# Patient Record
Sex: Female | Born: 1970
Health system: Southern US, Community
[De-identification: ages and names within clinical notes are randomized; demographics above are authoritative.]

## PROBLEM LIST (undated history)

## (undated) DIAGNOSIS — O020 Blighted ovum and nonhydatidiform mole: Secondary | ICD-10-CM

## (undated) DIAGNOSIS — K219 Gastro-esophageal reflux disease without esophagitis: Secondary | ICD-10-CM

## (undated) DIAGNOSIS — B9689 Other specified bacterial agents as the cause of diseases classified elsewhere: Secondary | ICD-10-CM

## (undated) DIAGNOSIS — J45998 Other asthma: Secondary | ICD-10-CM

## (undated) DIAGNOSIS — M1990 Unspecified osteoarthritis, unspecified site: Secondary | ICD-10-CM

## (undated) DIAGNOSIS — N760 Acute vaginitis: Secondary | ICD-10-CM

## (undated) DIAGNOSIS — N390 Urinary tract infection, site not specified: Secondary | ICD-10-CM

## (undated) DIAGNOSIS — O039 Complete or unspecified spontaneous abortion without complication: Secondary | ICD-10-CM

## (undated) HISTORY — PX: APPENDECTOMY: SHX54

---

## 1999-03-28 ENCOUNTER — Emergency Department (HOSPITAL_COMMUNITY): Admission: EM | Admit: 1999-03-28 | Discharge: 1999-03-28 | Payer: Self-pay | Admitting: Emergency Medicine

## 1999-11-23 ENCOUNTER — Encounter (INDEPENDENT_AMBULATORY_CARE_PROVIDER_SITE_OTHER): Payer: Self-pay | Admitting: *Deleted

## 1999-11-23 ENCOUNTER — Ambulatory Visit (HOSPITAL_COMMUNITY): Admission: RE | Admit: 1999-11-23 | Discharge: 1999-11-23 | Payer: Self-pay | Admitting: Obstetrics and Gynecology

## 2002-05-07 ENCOUNTER — Other Ambulatory Visit: Admission: RE | Admit: 2002-05-07 | Discharge: 2002-05-07 | Payer: Self-pay | Admitting: *Deleted

## 2002-09-27 ENCOUNTER — Inpatient Hospital Stay (HOSPITAL_COMMUNITY): Admission: AD | Admit: 2002-09-27 | Discharge: 2002-09-27 | Payer: Self-pay | Admitting: *Deleted

## 2002-10-19 ENCOUNTER — Inpatient Hospital Stay (HOSPITAL_COMMUNITY): Admission: AD | Admit: 2002-10-19 | Discharge: 2002-10-19 | Payer: Self-pay | Admitting: Obstetrics and Gynecology

## 2002-11-02 ENCOUNTER — Inpatient Hospital Stay (HOSPITAL_COMMUNITY): Admission: AD | Admit: 2002-11-02 | Discharge: 2002-11-02 | Payer: Self-pay | Admitting: Obstetrics and Gynecology

## 2002-11-03 ENCOUNTER — Encounter (INDEPENDENT_AMBULATORY_CARE_PROVIDER_SITE_OTHER): Payer: Self-pay

## 2002-11-03 ENCOUNTER — Inpatient Hospital Stay (HOSPITAL_COMMUNITY): Admission: AD | Admit: 2002-11-03 | Discharge: 2002-11-06 | Payer: Self-pay | Admitting: Obstetrics and Gynecology

## 2003-01-11 ENCOUNTER — Other Ambulatory Visit: Admission: RE | Admit: 2003-01-11 | Discharge: 2003-01-11 | Payer: Self-pay | Admitting: Obstetrics and Gynecology

## 2008-10-17 ENCOUNTER — Emergency Department (HOSPITAL_COMMUNITY): Admission: EM | Admit: 2008-10-17 | Discharge: 2008-10-17 | Payer: Self-pay | Admitting: Emergency Medicine

## 2009-06-01 LAB — CONVERTED CEMR LAB

## 2009-06-16 ENCOUNTER — Ambulatory Visit (HOSPITAL_COMMUNITY): Admission: RE | Admit: 2009-06-16 | Discharge: 2009-06-16 | Payer: Self-pay | Admitting: Pediatrics

## 2009-10-12 ENCOUNTER — Emergency Department (HOSPITAL_COMMUNITY): Admission: EM | Admit: 2009-10-12 | Discharge: 2009-10-12 | Payer: Self-pay | Admitting: Family Medicine

## 2009-11-02 ENCOUNTER — Ambulatory Visit: Payer: Self-pay | Admitting: Physician Assistant

## 2009-11-02 LAB — CONVERTED CEMR LAB
ALT: 15 units/L (ref 0–35)
AST: 19 units/L (ref 0–37)
Albumin: 4.7 g/dL (ref 3.5–5.2)
BUN: 9 mg/dL (ref 6–23)
Barbiturate Quant, Ur: NEGATIVE
Basophils Absolute: 0 10*3/uL (ref 0.0–0.1)
Cocaine Metabolites: NEGATIVE
Creatinine, Ser: 0.72 mg/dL (ref 0.40–1.20)
Hemoglobin: 14.1 g/dL (ref 12.0–15.0)
Lymphocytes Relative: 34 % (ref 12–46)
Lymphs Abs: 3.2 10*3/uL (ref 0.7–4.0)
MCHC: 32.6 g/dL (ref 30.0–36.0)
MCV: 87.8 fL (ref 78.0–100.0)
Methadone: NEGATIVE
Monocytes Absolute: 0.5 10*3/uL (ref 0.1–1.0)
Monocytes Relative: 6 % (ref 3–12)
Opiate Screen, Urine: NEGATIVE
Phencyclidine (PCP): NEGATIVE
RBC: 4.92 M/uL (ref 3.87–5.11)
Total Bilirubin: 0.4 mg/dL (ref 0.3–1.2)
Total Protein: 7.7 g/dL (ref 6.0–8.3)
WBC: 9.4 10*3/uL (ref 4.0–10.5)

## 2009-11-03 ENCOUNTER — Encounter: Payer: Self-pay | Admitting: Physician Assistant

## 2009-11-21 ENCOUNTER — Encounter: Payer: Self-pay | Admitting: Physician Assistant

## 2009-11-21 ENCOUNTER — Ambulatory Visit: Payer: Self-pay | Admitting: Internal Medicine

## 2009-11-23 DIAGNOSIS — L439 Lichen planus, unspecified: Secondary | ICD-10-CM | POA: Insufficient documentation

## 2009-12-04 ENCOUNTER — Ambulatory Visit: Payer: Self-pay | Admitting: Physician Assistant

## 2009-12-04 LAB — CONVERTED CEMR LAB: Hepatitis B Surface Ag: NEGATIVE

## 2009-12-05 ENCOUNTER — Encounter: Payer: Self-pay | Admitting: Physician Assistant

## 2010-03-13 ENCOUNTER — Ambulatory Visit: Payer: Self-pay | Admitting: Physician Assistant

## 2010-07-22 ENCOUNTER — Emergency Department (HOSPITAL_COMMUNITY): Admission: EM | Admit: 2010-07-22 | Discharge: 2010-07-22 | Payer: Self-pay | Admitting: Emergency Medicine

## 2010-09-14 ENCOUNTER — Emergency Department (HOSPITAL_COMMUNITY): Admission: EM | Admit: 2010-09-14 | Discharge: 2010-09-14 | Payer: Self-pay | Admitting: Emergency Medicine

## 2010-09-24 ENCOUNTER — Ambulatory Visit: Payer: Self-pay | Admitting: Nurse Practitioner

## 2010-09-24 DIAGNOSIS — N3000 Acute cystitis without hematuria: Secondary | ICD-10-CM | POA: Insufficient documentation

## 2010-09-24 DIAGNOSIS — N920 Excessive and frequent menstruation with regular cycle: Secondary | ICD-10-CM | POA: Insufficient documentation

## 2010-09-24 LAB — CONVERTED CEMR LAB: Rapid HIV Screen: NEGATIVE

## 2010-09-25 ENCOUNTER — Encounter (INDEPENDENT_AMBULATORY_CARE_PROVIDER_SITE_OTHER): Payer: Self-pay | Admitting: Nurse Practitioner

## 2010-09-26 ENCOUNTER — Encounter (INDEPENDENT_AMBULATORY_CARE_PROVIDER_SITE_OTHER): Payer: Self-pay | Admitting: Nurse Practitioner

## 2010-09-26 LAB — CONVERTED CEMR LAB
ALT: 15 units/L (ref 0–35)
AST: 14 units/L (ref 0–37)
Basophils Absolute: 0.1 10*3/uL (ref 0.0–0.1)
Basophils Relative: 0 % (ref 0–1)
Calcium: 9.1 mg/dL (ref 8.4–10.5)
Chloride: 106 meq/L (ref 96–112)
Cholesterol: 199 mg/dL (ref 0–200)
Eosinophils Absolute: 0.2 10*3/uL (ref 0.0–0.7)
Eosinophils Relative: 2 % (ref 0–5)
LDL Cholesterol: 125 mg/dL — ABNORMAL HIGH (ref 0–99)
Lymphocytes Relative: 29 % (ref 12–46)
Lymphs Abs: 3.4 10*3/uL (ref 0.7–4.0)
Monocytes Absolute: 0.7 10*3/uL (ref 0.1–1.0)
Monocytes Relative: 6 % (ref 3–12)
Neutrophils Relative %: 63 % (ref 43–77)
Potassium: 3.8 meq/L (ref 3.5–5.3)
RBC: 4.72 M/uL (ref 3.87–5.11)
Total Protein: 7.1 g/dL (ref 6.0–8.3)
Triglycerides: 177 mg/dL — ABNORMAL HIGH (ref <150)

## 2010-11-30 ENCOUNTER — Encounter (INDEPENDENT_AMBULATORY_CARE_PROVIDER_SITE_OTHER): Payer: Self-pay | Admitting: Nurse Practitioner

## 2010-11-30 ENCOUNTER — Ambulatory Visit: Payer: Self-pay | Admitting: Nurse Practitioner

## 2010-11-30 DIAGNOSIS — E669 Obesity, unspecified: Secondary | ICD-10-CM

## 2010-11-30 DIAGNOSIS — F17200 Nicotine dependence, unspecified, uncomplicated: Secondary | ICD-10-CM

## 2010-11-30 DIAGNOSIS — K029 Dental caries, unspecified: Secondary | ICD-10-CM | POA: Insufficient documentation

## 2010-11-30 LAB — CONVERTED CEMR LAB
Glucose, Urine, Semiquant: NEGATIVE
KOH Prep: NEGATIVE
Protein, U semiquant: NEGATIVE
pH: 7.5

## 2010-12-01 ENCOUNTER — Encounter (INDEPENDENT_AMBULATORY_CARE_PROVIDER_SITE_OTHER): Payer: Self-pay | Admitting: Internal Medicine

## 2010-12-06 ENCOUNTER — Encounter (INDEPENDENT_AMBULATORY_CARE_PROVIDER_SITE_OTHER): Payer: Self-pay | Admitting: Nurse Practitioner

## 2010-12-06 LAB — CONVERTED CEMR LAB
Basophils Absolute: 0.1 10*3/uL (ref 0.0–0.1)
HCT: 43.4 % (ref 36.0–46.0)
Monocytes Absolute: 0.8 10*3/uL (ref 0.1–1.0)
Monocytes Relative: 6 % (ref 3–12)
Neutro Abs: 9.3 10*3/uL — ABNORMAL HIGH (ref 1.7–7.7)
Platelets: 319 10*3/uL (ref 150–400)
RBC: 4.72 M/uL (ref 3.87–5.11)

## 2010-12-07 ENCOUNTER — Encounter
Admission: RE | Admit: 2010-12-07 | Discharge: 2010-12-07 | Payer: Self-pay | Source: Home / Self Care | Attending: Internal Medicine | Admitting: Internal Medicine

## 2010-12-23 ENCOUNTER — Encounter: Payer: Self-pay | Admitting: *Deleted

## 2011-01-01 NOTE — Progress Notes (Signed)
Summary: Office Visit/DEPRESSION SCREENING  Office Visit/DEPRESSION SCREENING   Imported By: Arta Bruce 12/22/2009 15:28:34  _____________________________________________________________________  External Attachment:    Type:   Image     Comment:   External Document

## 2011-01-01 NOTE — Assessment & Plan Note (Signed)
Summary: 1 MONTH FU ON SKIN///KT   Vital Signs:  Patient profile:   40 year old female Height:      65.5 inches Weight:      238 pounds BMI:     39.14 Temp:     97.8 degrees F oral Pulse rate:   88 / minute Pulse rhythm:   regular Resp:     18 per minute BP sitting:   118 / 81  (left arm) Cuff size:   large  Vitals Entered By: Armenia Shannon (December 04, 2009 10:58 AM) CC: one month f/u... Is Patient Diabetic? No Pain Assessment Patient in pain? no       Does patient need assistance? Functional Status Self care Ambulation Normal   CC:  one month f/u....  History of Present Illness: Here for f/u. Did see Derm. at [facility]. Had a hard time getting desonide.  Finally got about a week ago. Still has lesions scattered about her BUE and back.  Also, has had some pain in her mouth, but seems to be getting better.  Lesions painful if she bumps them.  Does not feel like things are getting worse. She thought the dermatologist was rude.  She had her son see someone at [facility].  WOuld like to go there if needed.   Current Medications (verified): 1)  Desonide 0.05 % Oint (Desonide) .... Apply Two Times A Day  Allergies (verified): No Known Drug Allergies  Physical Exam  General:  alert, well-developed, and well-nourished.   Head:  normocephalic and atraumatic.   Mouth:  no oral lesions noted Neck:  supple.   Lungs:  normal breath sounds, no crackles, and no wheezes.   Heart:  normal rate, regular rhythm, and no murmur.   Skin:  scattered papular lesions with violaceous color    Impression & Recommendations:  Problem # 1:  PREVENTIVE HEALTH CARE (ICD-V70.0) patient sees Dr. Normand Sloop with [facility] and gets paps through her  Problem # 2:  LICHEN PLANUS (ICD-697.0) just got meds will try them for a few weeks patient not happy with Derm. at [facility]. if no reponse to desonide, will refer to [facility]  Complete Medication List: 1)  Desonide 0.05 % Oint  (Desonide) .... Apply two times a day  Patient Instructions: 1)  Call Amarah Brossman in 3-4 weeks to update him on your rash.  If no better, will refer you to private dermatologist. 2)  Please schedule a follow-up appointment in 3 months with Roemello Speyer for skin problem. 3)

## 2011-01-01 NOTE — Assessment & Plan Note (Signed)
Summary: 3 month fu///kt   Vital Signs:  Patient profile:   40 year old female Height:      65.5 inches Weight:      214 pounds BMI:     35.20 Temp:     98.1 degrees F oral Pulse rate:   80 / minute Pulse rhythm:   regular Resp:     18 per minute BP sitting:   110 / 72  (right arm)  Vitals Entered By: Armenia Shannon (March 13, 2010 8:52 AM) CC: pt is here to f/u on rash.... pt says the rash is better and she would like a bleaching cream to get rid of the points... Is Patient Diabetic? No Pain Assessment Patient in pain? no       Does patient need assistance? Functional Status Self care Ambulation Normal   Primary Care Provider:  Tereso Newcomer PA-C  CC:  pt is here to f/u on rash.... pt says the rash is better and she would like a bleaching cream to get rid of the points....  History of Present Illness: Here for f/u on Lichen Planus. She used desonide for one month.  Had gone to derm clinic.   Rash is resolved.  However, she now has darkened pigmentation in areas where lesions were located. She is asking about using a bleaching cream.  She says her son had to use one after he had lichen planus.   Current Medications (verified): 1)  Desonide 0.05 % Oint (Desonide) .... Apply Two Times A Day  Allergies (verified): 1)  ! Penicillin  Physical Exam  General:  alert and well-developed.   Head:  normocephalic and atraumatic.   Lungs:  normal breath sounds.   Heart:  normal rate and regular rhythm.   Neurologic:  alert & oriented X3 and cranial nerves II-XII intact.   Skin:  several scattered areas of hyperpigmentation about her BUE and back Psych:  normally interactive.     Impression & Recommendations:  Problem # 1:  LICHEN PLANUS (ICD-697.0) resolved now with residual hyperpigmentation  spoke with Toni Amend in [place] pharm will use Hydroquinone 4 % two times a day should limit to small areas at a time d/c use if no response after 2 mos  Problem # 2:  PREVENTIVE  HEALTH CARE (ICD-V70.0) usually gets CPP with Dr. Normand Sloop but would have to pay cash last pap in 07/2009 schedule cpp in Aug. .  Wonda Cheng to cancel if gets appt with Dr. Normand Sloop  Complete Medication List: 1)  Desonide 0.05 % Oint (Desonide) .... Apply two times a day 2)  Hydroquinone 4 % Crea (Hydroquinone) .... Apply to areas two times a day until improvement; limit to small areas at one time; do not use for longer than 2 months in one area  Patient Instructions: 1)  Please schedule a follow-up appointment in 4-5 months with Kellyann Ordway for CPP. 2)  Come fasting for labs (nothing to eat or drink after midnight except water). 3)  Hydroquinone:  Use cream on small areas at a time.  Do not use more than 2 months in one area.  Stop if it causes irritation.  Prescriptions: HYDROQUINONE 4 % CREA (HYDROQUINONE) apply to areas two times a day until improvement; limit to small areas at one time; do not use for longer than 2 months in one area  #30 grams x 2   Entered and Authorized by:   Tereso Newcomer PA-C   Signed by:   Tereso Newcomer PA-C on 03/13/2010  Method used:   Printed then faxed to ...       [facility] (retail)       [address] [place], [place]  [address]       Ph: [phone]       Fax: [phone]   RxID:   [id number]

## 2011-01-01 NOTE — Letter (Signed)
Summary: [facility]   Imported By: Arta Bruce 12/27/2009 15:12:05  _____________________________________________________________________  External Attachment:    Type:   Image     Comment:   External Document

## 2011-01-01 NOTE — Assessment & Plan Note (Signed)
Summary: Left without being seen   Vital Signs:  Patient profile:   40 year old female Pulse rhythm:   regular Cuff size:   large  Allergies: 1)  ! Penicillin   Complete Medication List: 1)  Desonide 0.05 % Oint (Desonide) .... Apply two times a day 2)  Hydroquinone 4 % Crea (Hydroquinone) .... Apply to areas two times a day until improvement; limit to small areas at one time; do not use for longer than 2 months in one area 3)  Bactrim Ds 800-160 Mg Tabs (Sulfamethoxazole-trimethoprim) .... One tablet by mouth two times a day for infection

## 2011-01-01 NOTE — Letter (Signed)
Summary: *HSN Results Follow up  [facility]  [address] [place], [place] [address]   Phone: [phone]  Fax: [phone]      12/05/2009   [name] [address] [place], [place]  [address]   Dear  Ms. Monica Walton,                            ____S.Drinkard,FNP   ____D. Gore,FNP       ____B. McPherson,MD   ____V. Rankins,MD    ____E. Mulberry,MD    ____N. Daphine Deutscher, FNP  ____D. Reche Dixon, MD    ____K. Philipp Deputy, MD    _x___S. Alben Spittle, PA-C    This letter is to inform you that your recent test(s):  _______Pap Smear    ___x____Lab Test     _______X-ray    ___x____ is within acceptable limits  _______ requires a medication change  _______ requires a follow-up lab visit  _______ requires a follow-up visit with your provider   Comments: Hepatitis labs were completely normal.       _________________________________________________________ If you have any questions, please contact our office                     Sincerely,  Tereso Newcomer PA-C [facility]

## 2011-01-01 NOTE — Letter (Signed)
Summary: Lipid Letter  [facility]  [address] [place], [place] [address]   Phone: [phone]  Fax: [phone]    09/26/2010  [name] [address], [place]  [address]  Dear Monica Walton:  We have carefully reviewed your last lipid profile from 09/24/2010 and the results are noted below with a summary of recommendations for lipid management.    Cholesterol:       199     Goal: less than 200   HDL "good" Cholesterol:   39     Goal: greater than 40   LDL "bad" Cholesterol:   125     Goal:  less than 130   Triglycerides:       177     Goal: less than 150  See above results of your cholesterol labs done during recent office visit.    If you have any questions, please call. We appreciate being able to work with you.   Sincerely,    Lehman Prom, FNP [facility]

## 2011-01-01 NOTE — Assessment & Plan Note (Signed)
Summary: Acute - Cystitis   Vital Signs:  Patient profile:   40 year old female LMP:     09/22/2010 Height:      65.5 inches Weight:      211 pounds BMI:     34.70 Temp:     98.7 degrees F oral Pulse rate:   76 / minute Pulse rhythm:   regular Resp:     16 per minute BP sitting:   112 / 76  (left arm) Cuff size:   large  Vitals Entered By: Armenia Shannon (September 24, 2010 4:17 PM)  Nutrition Counseling: Patient's BMI is greater than 25 and therefore counseled on weight management options. CC: pt is here for bladder infection.... pt says she went to urgent care on 10-14... pt says she is having blood clots... pt says she was leaking fluid for three/four days before her cycle... Pain Assessment Patient in pain? no       Does patient need assistance? Functional Status Self care Ambulation Normal LMP (date): 09/22/2010     Enter LMP: 09/22/2010 Last PAP Result historical per patient - sees Dr. Normand Sloop   Primary Care Lyzbeth Genrich:  Tereso Newcomer PA-C  CC:  pt is here for bladder infection.... pt says she went to urgent care on 10-14... pt says she is having blood clots... pt says she was leaking fluid for three/four days before her cycle....  History of Present Illness:  Pt into the office for f/u on urinary symptoms. She presents today with Cipro that she started on 09/15/2010. she has 4 pills left in her bottle. She reports frequent UTI"S during her previous pregancy  Menses - Previous menses September 28 - started out extremely heavy and last 7 days.  currently on and started October 22. She had to wear lots of pads and heavy flow with clotting. -cramps but she had some abdominal discomfort after the cystitis dx +back pain  3 living children last PAP done 07/2009   Current Medications (verified): 1)  Desonide 0.05 % Oint (Desonide) .... Apply Two Times A Day 2)  Hydroquinone 4 % Crea (Hydroquinone) .... Apply To Areas Two Times A Day Until Improvement; Limit To Small  Areas At One Time; Do Not Use For Longer Than 2 Months in One Area  Allergies (verified): 1)  ! Penicillin  Review of Systems CV:  Denies chest pain or discomfort. Resp:  Denies cough. GI:  Complains of abdominal pain; denies constipation, nausea, and vomiting. GU:  Complains of abnormal vaginal bleeding and discharge; denies dysuria, nocturia, and urinary frequency; heavy menses for september and october. MS:  Complains of low back pain.  Physical Exam  General:  alert.   Head:  normocephalic.   Eyes:  pupils reactive to light.   Lungs:  normal breath sounds.   Heart:  normal rate and regular rhythm.     Impression & Recommendations:  Problem # 1:  ACUTE CYSTITIS (ICD-595.0)  will recheck urine today advised pt to continue cipro until the end of the course plenty of water Orders: T-Culture, Urine ([id number])  Problem # 2:  MENORRHAGIA (ICD-626.2) will start with labs but may need additional workup with u/s Orders: T-Comprehensive Metabolic Panel (44010-27253) T-CBC w/Diff (66440-34742) T-TSH (59563-87564) T-Lipid Profile (33295-18841) Rapid HIV  (66063) T-Syphilis Test (RPR) (01601-09323)  Problem # 3:  NEED PROPHYLACTIC VACCINATION&INOCULATION FLU (ICD-V04.81) given today in office  Complete Medication List: 1)  Desonide 0.05 % Oint (Desonide) .... Apply two times a day 2)  Hydroquinone 4 %  Crea (Hydroquinone) .... Apply to areas two times a day until improvement; limit to small areas at one time; do not use for longer than 2 months in one area  Other Orders: Flu Vaccine 65yrs + ([address]) Admin 1st Vaccine ([address])  Patient Instructions: 1)  Take all your antibiotics as ordered.  Remember to drink plenty of water.  Wipe from front to back.  Wear cotton underwear. 2)  You given the flu vaccine today. 3)  Schedule an appointment for a complete physical exam 4)  You will get mammogram, u/a, PHQ-9    Orders Added: 1)  Est. Patient Level III [[address]] 2)   T-Comprehensive Metabolic Panel [80053-22900] 3)  T-CBC w/Diff [91478-29562] 4)  T-TSH [13086-57846] 5)  T-Lipid Profile [96295-28413] 6)  Rapid HIV  [92370] 7)  T-Syphilis Test (RPR) [24401-02725] 8)  T-Culture, Urine [36644-03474] 9)  Flu Vaccine 68yrs + [[address]] 10)  Admin 1st Vaccine [[address]]   Immunizations Administered:  Influenza Vaccine # 1:    Vaccine Type: Fluvax 3+    Site: left deltoid    Mfr: GlaxoSmithKline    Dose: 0.5 ml    Route: IM    Given by: Armenia Shannon    Exp. Date: 06/01/2011    Lot #: [id number]    VIS given: 06/26/10 version given September 24, 2010.  Flu Vaccine Consent Questions:    Do you have a history of severe allergic reactions to this vaccine? no    Any prior history of allergic reactions to egg and/or gelatin? no    Do you have a sensitivity to the preservative Thimersol? no    Do you have a past history of Guillan-Barre Syndrome? no    Do you currently have an acute febrile illness? no    Have you ever had a severe reaction to latex? no    Vaccine information given and explained to patient? yes    Are you currently pregnant? no   Immunizations Administered:  Influenza Vaccine # 1:    Vaccine Type: Fluvax 3+    Site: left deltoid    Mfr: GlaxoSmithKline    Dose: 0.5 ml    Route: IM    Given by: Armenia Shannon    Exp. Date: 06/01/2011    Lot #: [id number]    VIS given: 06/26/10 version given September 24, 2010.  Prevention & Chronic Care Immunizations   Influenza vaccine: Fluvax 3+  (09/24/2010)    Tetanus booster: Not documented    Pneumococcal vaccine: Not documented  Other Screening   Pap smear: historical per patient - sees Dr. Normand Sloop  (06/01/2009)   Smoking status: quit  (11/02/2009)  Lipids   Total Cholesterol: Not documented   LDL: Not documented   LDL Direct: Not documented   HDL: Not documented   Triglycerides: Not documented   Nursing Instructions: Give Flu vaccine today        Laboratory Results   Date/Time Received: September 24, 2010   Other Tests  Rapid HIV: negative

## 2011-01-03 NOTE — Progress Notes (Signed)
Summary: //DEPRESSION SCREENING  //DEPRESSION SCREENING   Imported By: Arta Bruce 11/30/2010 10:27:26  _____________________________________________________________________  External Attachment:    Type:   Image     Comment:   External Document

## 2011-01-03 NOTE — Letter (Signed)
Summary: *HSN Results Follow up  [facility]  [address] [place], [place] [address]   Phone: [phone]  Fax: [phone]      12/06/2010   TONJI ELLIFF [address] [place], [place]  [address]   Dear  Ms. Shalane MOORE,                            ____S.Drinkard,FNP   ____D. Gore,FNP       ____B. McPherson,MD   ____V. Rankins,MD    ____E. Mulberry,MD    __X__N. Daphine Deutscher, FNP  ____D. Reche Dixon, MD    ____K. Philipp Deputy, MD    ____Other     This letter is to inform you that your recent test(s):  ___X____Pap Smear    _______Lab Test     _______X-ray   Pap Smear results are __________________________.      _________________________________________________________ If you have any questions, please contact our office [phone].                    Sincerely,    Lehman Prom FNP [facility]

## 2011-01-03 NOTE — Assessment & Plan Note (Signed)
Summary: Complete Physical Exam   Vital Signs:  Patient profile:   40 year old female Menstrual status:  regular LMP:     11/19/2010 Weight:      211.2 pounds Temp:     98.2 degrees F oral Pulse rate:   72 / minute Pulse rhythm:   regular Resp:     16 per minute BP sitting:   106 / 80  (left arm) Cuff size:   large  Vitals Entered By: Levon Hedger (November 30, 2010 9:01 AM) CC: cpp Is Patient Diabetic? No Pain Assessment Patient in pain? no       Does patient need assistance? Functional Status Self care Ambulation Normal  Vision Screening:Left eye with correction: 20 / 20 Right eye with correction: 20 / 15-1 Both eyes with correction: 20 / 13-1        Vision Entered By: Levon Hedger (November 30, 2010 9:34 AM) LMP (date): 11/19/2010 LMP - Character: heavy     Menstrual Status regular Enter LMP: 11/19/2010 Last PAP Result historical per patient - sees Dr. Normand Sloop   Primary Care Provider:  Tereso Newcomer PA-C  CC:  cpp.  History of Present Illness:  Pt into the office for a complete physical exam  PAP - Last PAP done 07/2009. No family history of cervical or ovarian cancer in the family 3 children  Pt is in a stable relationship - no current birth control Menses monthly but she has recently noticed that it his getting heavier with clots.  No cramps.  Mammogram - never had a mammogram.   Great Aunt (maternal) deceased of breast cancer no current self breast exams at home  Optho - wear contacts.Yearly optho exams   Dental  - no recent dental exam..  Admits that she does need to go to the dentist b/c she has a wisdom tooth that needs extraction  tdap - last over 10 years ago  Obesity - no change in weight since last visit.  She is not exercising but she does admit that she needs to lose weight  Habits & Providers  Alcohol-Tobacco-Diet     Alcohol drinks/day: 0     Tobacco Status: current     Tobacco Counseling: to quit use of tobacco  products     Year Quit: 2003     Pack years: 2  Exercise-Depression-Behavior     Have you felt down or hopeless? no     Have you felt little pleasure in things? no     Depression Counseling: not indicated; screening negative for depression     Drug Use: no  Comments: PHQ- 9 score = 3 [employer] membershp but pt is not going to the gym.  She will restart as her new years resolution  Allergies: 1)  ! Penicillin  Social History: Smoking Status:  current  Review of Systems General:  Denies fever. Eyes:  Denies discharge. ENT:  Denies earache. CV:  Denies chest pain or discomfort. Resp:  Denies cough. GI:  Denies abdominal pain, nausea, and vomiting. GU:  Denies abnormal vaginal bleeding. MS:  Denies joint pain. Derm:  Denies dryness. Neuro:  Denies headaches. Psych:  Denies anxiety and depression. Endo:  Denies excessive urination.  Physical Exam  General:  alert.   Head:  normocephalic.   Eyes:  pupils equal and pupils round.  contacts Ears:  bil TM with bony landmarks present Nose:  no nasal discharge.   Mouth:  pharynx pink and moist and fair dentition.   Neck:  supple.   Chest Wall:  no mass.   Breasts:  right breast - upper outer quad 11 o'clock - palpable, nodule left breast - no nodules or lumps noted. Lungs:  normal breath sounds.   Heart:  normal rate and regular rhythm.   Abdomen:  normal bowel sounds.   Rectal:  defer Msk:  normal ROM.   Pulses:  R radial normal and L radial normal.   Extremities:  no edema Neurologic:  alert & oriented X3, cranial nerves II-XII intact, and gait normal.   Skin:  multiple tattoos Psych:  Oriented X3.    Pelvic Exam  Vulva:      normal appearance.   Urethra and Bladder:      Urethra--normal.  Bladder--normal.   Vagina:      physiologic discharge.   Cervix:      midposition, parous.   Uterus:      smooth.   Adnexa:      nontender bilaterally.      Impression & Recommendations:  Problem # 1:  ROUTINE  GYNECOLOGICAL EXAMINATION (ICD-V72.31) PAP done PHQ-9 score = 3 rec optho and dental exam Orders: UA Dipstick w/o Micro (manual) (14782) T- GC Chlamydia (95621) KOH/ WET Mount [phone]) Pap Smear, Thin Prep ( Collection of) (H8469) EKG w/ Interpretation (93000) Vision Screening (62952)  Problem # 2:  UNSPECIFIED BREAST SCREENING (ICD-V76.10) lump noted today during exam self breast exam placcard given to pt Orders: Mammogram (Diagnostic) (Mammo)  Problem # 3:  TOBACCO ABUSE (ICD-305.1) advised cessation  Problem # 4:  DENTAL CARIES (ICD-521.00)  Orders: Dental Referral (Dentist)  Problem # 5:  ACUTE CYSTITIS (ICD-595.0) pt finished abx as per previous visit. will recheck urine The following medications were removed from the medication list:    Bactrim Ds 800-160 Mg Tabs (Sulfamethoxazole-trimethoprim) ..... One tablet by mouth two times a day for infection  Orders: T-Culture, Urine (84132-44010)  Problem # 6:  OBESITY (ICD-278.00) pt advised to attend the [employer] program to be held here she will also restart her gym membership  Complete Medication List: 1)  Desonide 0.05 % Oint (Desonide) .... Apply two times a day 2)  Hydroquinone 4 % Crea (Hydroquinone) .... Apply to areas two times a day until improvement; limit to small areas at one time; do not use for longer than 2 months in one area  Other Orders: T-CBC w/Diff (27253-66440)  Patient Instructions: 1)  Join the [employer] in 2012. 2)  Keep your appointment for mammogram as scheduled. 3)  Be sure to start checking your breast monthly at home. 4)  Follow up yearly for a complete physical exam or sooner for acute illnesses. 5)  Need tdap on next visit   Orders Added: 1)  Dental Referral [Dentist] 2)  UA Dipstick w/o Micro (manual) [81002] 3)  T-Culture, Urine [34742-59563] 4)  T- GC Chlamydia [87564] 5)  KOH/ WET Mount [87210] 6)  Pap Smear, Thin Prep ( Collection of) [Q0091] 7)  EKG w/  Interpretation [93000] 8)  Vision Screening [99173] 9)  Est. Patient age 17-64 [phone] 31)  Mammogram (Diagnostic) [Mammo] 11)  T-CBC w/Diff [18841-66063]    Laboratory Results   Urine Tests  Date/Time Received: November 30, 2010 9:34 AM   Routine Urinalysis   Color: lt. yellow Appearance: Clear Glucose: negative   (Normal Range: Negative) Bilirubin: negative   (Normal Range: Negative) Ketone: negative   (Normal Range: Negative) Spec. Gravity: 1.015   (Normal Range: 1.003-1.035) Blood: negative   (Normal Range:  Negative) pH: 7.5   (Normal Range: 5.0-8.0) Protein: negative   (Normal Range: Negative) Urobilinogen: 0.2   (Normal Range: 0-1) Nitrite: negative   (Normal Range: Negative) Leukocyte Esterace: negative   (Normal Range: Negative)    Date/Time Received: November 30, 2010 10:13 AM   Wet Mount/KOH Source: vaginal WBC/hpf: 1-5 Bacteria/hpf: rare Clue cells/hpf: none Yeast/hpf: none Trichomonas/hpf: none    Laboratory Results   Urine Tests    Routine Urinalysis   Color: lt. yellow Appearance: Clear Glucose: negative   (Normal Range: Negative) Bilirubin: negative   (Normal Range: Negative) Ketone: negative   (Normal Range: Negative) Spec. Gravity: 1.015   (Normal Range: 1.003-1.035) Blood: negative   (Normal Range: Negative) pH: 7.5   (Normal Range: 5.0-8.0) Protein: negative   (Normal Range: Negative) Urobilinogen: 0.2   (Normal Range: 0-1) Nitrite: negative   (Normal Range: Negative) Leukocyte Esterace: negative   (Normal Range: Negative)      Wet Mount Wet Mount KOH: Negative    Prevention & Chronic Care Immunizations   Influenza vaccine: Fluvax 3+  (09/24/2010)    Tetanus booster: Not documented   Td booster deferral: Not available  (11/30/2010)    Pneumococcal vaccine: Not documented  Other Screening   Pap smear: historical per patient - sees Dr. Normand Sloop  (06/01/2009)   Smoking status: current  (11/30/2010)  Lipids   Total  Cholesterol: 199  (09/24/2010)   LDL: 125  (09/24/2010)   LDL Direct: Not documented   HDL: 39  (09/24/2010)   Triglycerides: 177  (09/24/2010)    EKG  Procedure date:  11/30/2010  Findings:      NSR

## 2011-01-03 NOTE — Letter (Signed)
Summary: DENTAL REFERRAL  DENTAL REFERRAL   Imported By: Arta Bruce 12/04/2010 16:55:46  _____________________________________________________________________  External Attachment:    Type:   Image     Comment:   External Document

## 2011-02-06 ENCOUNTER — Telehealth (INDEPENDENT_AMBULATORY_CARE_PROVIDER_SITE_OTHER): Payer: Self-pay | Admitting: Nurse Practitioner

## 2011-02-14 LAB — POCT URINALYSIS DIPSTICK
Glucose, UA: NEGATIVE mg/dL
Ketones, ur: NEGATIVE mg/dL
Protein, ur: 100 mg/dL — AB
Specific Gravity, Urine: >=1.03 (ref 1.005–1.030)
Urobilinogen, UA: 0.2 mg/dL (ref 0.0–1.0)

## 2011-02-14 LAB — POCT PREGNANCY, URINE: Preg Test, Ur: NEGATIVE

## 2011-02-15 LAB — URINE CULTURE: Colony Count: >=100000

## 2011-02-15 LAB — POCT URINALYSIS DIPSTICK
Nitrite: POSITIVE — AB
Protein, ur: 100 mg/dL — AB
pH: 5.5 (ref 5.0–8.0)

## 2011-02-15 LAB — POCT PREGNANCY, URINE: Preg Test, Ur: NEGATIVE

## 2011-02-28 NOTE — Progress Notes (Signed)
Summary: WANTS TO TALK TO NURSE  Phone Note Call from Patient Call back at [facility] Phone [phone]   Summary of Call: PT MISSED APPT TODAY BECAUSE SHE DID NOT KNOW OF THE NEW LOCATION. PT IS NOW SAYING SHE DOES NOT NEEDTO SEE THE DOCTOR BUT PERHAPS ONLY A NURSE VISIT.  THE NOTES SAID FU BLADDER INFECTION & NO PERIOD. PLS CALL PT BK. Initial call taken by: Ayesha Rumpf,  February 06, 2011 9:01 AM  Follow-up for Phone Call         pt confirms pregnant-need medical confirmation, Medicaid family, needs a call.  Ernestine Mcmurray,  February 07, 2011 8:24 AM   Left message on voicemail for pt. to return call.  Does she need to come in for pregnancy test to get referral to GYN?   Dutch Quint RN  February 08, 2011 12:34 PM    Additional Follow-up for Phone Call Additional follow up Details #1::        yes she needs a  nurse visit will need a date for LMP to calculate EDD Additional Follow-up by: Lehman Prom FNP,  February 08, 2011 2:15 PM    Additional Follow-up for Phone Call Additional follow up Details #2::    Left message on voicemail for pt. to return call.  Dutch Quint RN  February 13, 2011 5:55 PM  Left message on voicemail for pt. to return call.   Dutch Quint RN  February 14, 2011 2:45 PM  Micah Flesher to [facility] for urine pregnancy test, got due date of 09/29/11.  Waiting for Medicaid, has OB-GYN.  Wants to know if she needs to come back in for f/u UA with culture s/p ABT for UTI on 12/02/11?  Asymptomatic at present.  Dutch Quint RN  February 18, 2011 12:49 PM   Additional Follow-up for Phone Call Additional follow up Details #3:: Details for Additional Follow-up Action Taken: no need. looks like pt that done everything appropriately and she  is asymptomatice n.martin,fnp  February 18, 2011  2:30 PM  Pt. advised of provider's response.  Verbalized understanding and agreement.  Dutch Quint RN  February 18, 2011 4:31 PM

## 2011-04-19 NOTE — H&P (Signed)
NAME:  Monica Walton, Monica Walton                       ACCOUNT NO.:  [id number]   MEDICAL RECORD NO.:  [id number]                   PATIENT TYPE:  MAT   LOCATION:  [facility]                                 [facility]   PHYSICIAN:  Naima A. Dillard, M.D.              DATE OF BIRTH:  1971-08-14   DATE OF ADMISSION:  11/03/2002  DATE OF DISCHARGE:                                HISTORY & PHYSICAL   HISTORY OF PRESENT ILLNESS:  The patient is a 40 year old gravida 6, para 2-  0-3-2 at 72 and 5/7 weeks, EDD October 28, 2002 who presents with bright  red vaginal bleeding that turned the water red while she was in the bathtub  at approximately 12:30 this afternoon.  She reports positive fetal movement,  questionable rupture of membranes.  She has no headaches, visual changes, or  epigastric pain.  She has no abdominal pain and is only noticing mild  irregular contractions no different than they have been.  She was evaluated  yesterday in the maternity admissions unit following some vaginal bleeding  which was scant and limited to that period of time and resolved.  She had a  BPP which was 8/8.  The baby was in a vertex position.  Fluid was normal.  The placenta was noted to be 1.6 cm from the os.  At that time yesterday the  fetal heart rate was reactive and reassuring.  On vaginal examination she  was noted to be 1-2 cm dilated, 70% effaced, and the cephalic presenting  part high.  Only a scant amount of dark blood was noted with no active  bleeding, again, at that time and she was discharged home whereby she  returned today following this episode of bleeding.  Her pregnancy has been  followed by the M.D. service at [facility] and is remarkable for low lying  placenta 1.6 cm from the cervical os, history of three ABs, two elective ABs  and one SAB, history of migraines, history of preterm labor with term  delivery, rubella non-immune, penicillin allergy, questionable LMP, and  negative group B Strep.   This patient was initially evaluated at the office  of [facility] on June 24, 2002.  The patient transferred care from Dr. Elliot Gault at  23 weeks.  Her pregnancy has been complicated with placenta previa which has  resolved to a low lying placenta.  She has been size equal to dates  throughout.  LMP was determined by early pregnancy ultrasonography and  confirmed with follow-up.  She has been normotensive with no proteinuria.   OB HISTORY:  In 1993 patient had a normal spontaneous vaginal delivery at 41  weeks with the birth of a 7 pound 13 ounce female infant named Optometrist.  In  1997 a normal spontaneous vaginal delivery with the birth of a 6 pound 6  ounce female infant named Chales Abrahams at term with no complications.  In the year  2000 she had a spontaneous AB and then in November 2000 and in April 2001  patient had elective ABs and the present pregnancy.   PAST MEDICAL HISTORY:  She has a history of asthma and migraines, a history  of pyelonephritis with pregnancy.  She had an appendectomy at age 29.   FAMILY HISTORY:  Maternal grandmother with chronic hypertension and  diabetes.  Maternal grandfather with lung cancer.  Maternal grandmother with  stroke.   GENETIC HISTORY:  There is no family history of familial or genetic  disorders, children that died in infancy or that were born with birth  defects.   SOCIAL HISTORY:  The patient is a single African-American female.  The  father of the baby, Jerilynn Mages, is involved.  They are [facility] in their  faith.  Denies the use of tobacco, alcohol, or illicit drugs.   ALLERGIES:  The patient is allergic to PENICILLIN.   PRENATAL LABORATORY WORK:  On May 04, 2002 hemoglobin and hematocrit 11.9  and 34.5, platelets 258,000.  Blood type and Rh A+.  Antibody screen  negative.  Sickle cell trait negative.  VDRL nonreactive.  Rubella  nonimmune.  Hepatitis B surface antigen negative.  HIV declined.  Pap smear  within normal limits.  GC and Chlamydia negative.   AFP/free beta hCG within  normal range.  One hour glucose challenge 127.  Hemoglobin 10.4.  At 36  weeks culture of the vaginal tract is negative for group B Strep.   REVIEW OF SYSTEMS:  As described above.  The patient is at term with third  trimester bleeding, stable vital signs, reassuring fetal heart rate at term.   PHYSICAL EXAMINATION:  VITAL SIGNS:  Stable, afebrile.  HEENT:  Unremarkable.  HEART:  Regular rate and rhythm.  LUNGS:  Clear.  ABDOMEN:  Soft and nontender.  The patient is contracting mildly and  irregularly.  The fetal heart rate with a baseline of 140 is positive  variability, positive accelerations.  PELVIC:  Sterile speculum examination finds a large, approximately 10 inch  clot in the vagina which was evacuated and a small continuous trickle of  blood is noted from the cervical os.  On examination by Dr. Jaymes Graff  the cervix is found to be 2 cm dilated, 80% effaced, far posterior.  EXTREMITIES:  No pathologic edema.  DTRs are 1+ with no clonus.   ASSESSMENT:  1. Intrauterine pregnancy at term.  2. Third trimester bleeding.  3. Low lying placenta.  Admit per Dr. Jaymes Graff.  Orders per Dr.     Normand Sloop.  Start low dose Pitocin and call for bleeding soaking more than     one pad per hour.  Dr. Normand Sloop to do all cervical examinations.     Rica Koyanagi, C.N.M.               Naima A. Normand Sloop, M.D.    SDM/MEDQ  D:  11/03/2002  T:  11/03/2002  Job:  [id number]

## 2011-04-19 NOTE — Op Note (Signed)
NAME:  Monica Walton, Monica Walton                       ACCOUNT NO.:  [id number]   MEDICAL RECORD NO.:  [id number]                   PATIENT TYPE:  INP   LOCATION:  9111                                 [facility]ILITY:  [facility]   PHYSICIAN:  Naima A. Dillard, M.D.              DATE OF BIRTH:  11/27/1971   DATE OF PROCEDURE:  11/03/2002  DATE OF DISCHARGE:                                 OPERATIVE REPORT   PREOPERATIVE DIAGNOSES:  1. Intrauterine pregnancy at term.  2. Low lying placenta in labor with hemorrhage.   POSTOPERATIVE DIAGNOSES:  1. Intrauterine pregnancy at term.  2. Low lying placenta in labor with hemorrhage.  3. Nuchal cord x2 and body cord.   PROCEDURE:  Primary low transverse cesarean section.   ANESTHESIA:  Epidural.   SURGEON:  Naima A. Dillard, M.D.   ASSISTANT:  Cam Hai, C.N.M.   ESTIMATED BLOOD LOSS:  800 cc.   URINE OUTPUT:  150 cc clear urine.   IV FLUIDS:  2100 cc crystalloid.   COMPLICATIONS:  None.   FINDINGS:  Female infant in vertex presentation with clear fluid.  He had a  nuchal cord x2 and a body cord x1.  Apgars were 8 and 9.  Cord pH was 7.31.  Weight was 9 pounds 3 ounces.  She had normal appearing uterus, tubes, and  ovaries bilaterally and normal appearing abdominal anatomy.   PROCEDURE IN DETAIL:  Before proceeding to the ER because of the heavy  bleeding, patient was told that I would recommend her having a cesarean  section.  She understands the risks are, but not limited to, bleeding,  infection, damage to internal organs such as bowel, bladder, major blood  vessels.  The patient consented to cesarean section and was taken to the OR  and placed in the dorsal supine position with a left lateral tilt.  When her  epidural was found to be adequate a Pfannenstiel skin incision was made with  a knife and carried down to the fascia using Bovie.  The fascia was then  incised in the midline, extended bilaterally using Mayo scissors.  Kochers  x2 were  placed in the superior aspect of the fascia which was dissected off  the rectus muscles both sharply and bluntly.  The inferior aspect of the  rectus muscles were separated from the fascia in a similar fashion.  The  rectus muscles were separated in the midline.  Peritoneum was identified,  tented up, and entered sharply using Metzenbaum scissors.  Peritoneal  incision was extended superiorly and inferiorly with good visualization of  bowel and bladder.  Bladder blade was placed.  The vesicouterine peritoneum  was identified, tented up, and entered sharply with Metzenbaum scissors and  extended bilaterally.  The bladder flap created digitally.  Bladder blade  was reinserted.  A primary low transverse uterine incision was then made  with a scalpel and extended bilaterally bluntly.  The  infant's head was  elevated to the incision.  Because of difficulty to deliver the head, KIWI  vacuum was placed on the head with one pull at 500 mmHg in the green zone.  After the head was delivered the suction was released.  The body was  delivered.  Cord was clamped and cut.  Infant was handed off to the waiting  pediatricians.  Cord blood and cord pH were obtained.  Placenta was manually  delivered.  The uterus was cleared of all clot and debris.  Uterine incision  was repaired with 0 Vicryl in a running locked fashion.  A second layer  embrocating stitch was used to obtain hemostasis.  There were still some  small areas of bleeding which were made hemostatic with figure-of-eight  stitches.  The abdomen was irrigated with saline.  The tubes and ovaries  were seen and found to be normal.  The fascia was closed with 0 Vicryl in a  running fashion.  Subcutaneous tissue was irrigated and any bleeding areas  were made hemostatic with Bovie.  The skin was closed with staples.  Sponge,  lap, and needle counts were correct x2.  The patient went to recovery room  in stable condition.                                                Naima A. Normand Sloop, M.D.    NAD/MEDQ  D:  11/03/2002  T:  11/03/2002  Job:  [id number]

## 2011-04-19 NOTE — Discharge Summary (Signed)
NAME:  Monica Walton, TURCOTT                       ACCOUNT NO.:  [id number]   MEDICAL RECORD NO.:  [id number]                   PATIENT TYPE:  INP   LOCATION:  9111                                 [facility]   PHYSICIAN:  Crist Fat. Rivard, M.D.              DATE OF BIRTH:  [birth year]   DATE OF ADMISSION:  11/03/2002  DATE OF DISCHARGE:  11/06/2002                                 DISCHARGE SUMMARY   ADMISSION DIAGNOSES:  1. Intrauterine pregnancy at term.  2. Third trimester bleeding.  3. Low-lying placenta.   DISCHARGE DIAGNOSES:  1. Intrauterine pregnancy at term.  2. Low-lying placenta and labor with hemorrhage.  3. Cesarean section.   PROCEDURE:  Primary low transverse cesarean section, epidural for  anesthesia.   HISTORY OF PRESENT ILLNESS:  The patient is a 40 year old gravida 6, para 2-  0-3-2 at 40-5/7 weeks who presented with bright red vaginal bleeding that  turned to watery red while she was in the bathtub on November 03, 2002.  Her  pregnancy had been followed by the [facility] M.D[facility] Service and  had been remarkable for:  (1) Low-lying placenta 1.6 cm from the cervical  os, (2) history of three ABs, two elective ABs and one SAB, (3) history of  migraines, (4) history of preterm labor with term delivery, (5) rubella not  immune, (6) PENICILLIN ALLERGY, (7) questionable last menstrual period and  (8) negative group B strep.   HOSPITAL COURSE:  The patient was 2 cm dilated upon admission and Pitocin  was started and bleeding was to be closely observed.  The patient progressed  to 5 cm dilation on the Pitocin but continued to have heavy bleeding.  Dr.  Normand Sloop discussed the risks and benefits of cesarean section and the patient  agreed to proceed.  The patient was delivered of a viable female infant,  weight was 7 pounds 3 ounces, Apgars were 8/9, cord pH was 7.31.  He was  taken to the full term nursery in good condition.  The patient tolerated the  cesarean section well and was taken to recovery in good condition.  By  postop day #1, her hemoglobin was 8.2, vital signs were stable.  She was  afebrile and doing well.  By postop day #2, her hemoglobin was 7.6.  The  patient was without hemodynamic instability.  She declined a transfusion and  was continuing to do well.  By postop day #3, she was deemed to have  received the full benefit of her hospital stay and was discharged home.   DISCHARGE INSTRUCTIONS:  Per [facility] handout.   DISCHARGE MEDICATIONS:  1. Motrin 600 mg p.o. q.6 h. p.r.n. pain.  2. Tylox 1-2 p.o. q.3-4 h. p.r.n. pain.  3. Micronor one p.o. q.d. to start two weeks after delivery.  4.     Feosol one p.o. q.d.  5. Prenatal vitamin one p.o. q.d.  FOLLOW UP:  Followup to occur at [facility] in six weeks  postpartum.     Cam Hai, C.N.M.                     Crist Fat Rivard, M.D.    KS/MEDQ  D:  11/06/2002  T:  11/07/2002  Job:  [id number]

## 2012-02-16 ENCOUNTER — Encounter (HOSPITAL_COMMUNITY): Payer: Self-pay | Admitting: Emergency Medicine

## 2012-02-16 ENCOUNTER — Emergency Department (INDEPENDENT_AMBULATORY_CARE_PROVIDER_SITE_OTHER)
Admission: EM | Admit: 2012-02-16 | Discharge: 2012-02-16 | Disposition: A | Discharge status: Home / Self Care | Payer: Self-pay | Source: Home / Self Care | Attending: Emergency Medicine | Admitting: Emergency Medicine

## 2012-02-16 DIAGNOSIS — A599 Trichomoniasis, unspecified: Secondary | ICD-10-CM

## 2012-02-16 LAB — WET PREP, GENITAL: Trich, Wet Prep: NONE SEEN

## 2012-02-16 LAB — POCT PREGNANCY, URINE: Preg Test, Ur: NEGATIVE

## 2012-02-16 MED ORDER — METRONIDAZOLE 500 MG PO TABS
ORAL_TABLET | ORAL | Status: AC
  Filled 2012-02-16: qty 4

## 2012-02-16 MED ORDER — METRONIDAZOLE 500 MG PO TABS
2000.0000 mg | ORAL_TABLET | Freq: Once | ORAL | Status: AC
  Administered 2012-02-16: 2000 mg via ORAL

## 2012-02-16 NOTE — Discharge Instructions (Signed)
Trichomoniasis Trichomoniasis is an infection, caused by the Trichomonas organism, that affects both women and men. In women, the outer female genitalia and the vagina are affected. In men, the penis is mainly affected, but the prostate and other reproductive organs can also be involved. Trichomoniasis is a sexually transmitted disease (STD) and is most often passed to another person through sexual contact. The majority of people who get trichomoniasis do so from a sexual encounter and are also at risk for other STDs. CAUSES   Sexual intercourse with an infected partner.   It can be present in swimming pools or hot tubs.  SYMPTOMS   Abnormal gray-green frothy vaginal discharge in women.   Vaginal itching and irritation in women.   Itching and irritation of the area outside the vagina in women.   Penile discharge with or without pain in males.   Inflammation of the urethra (urethritis), causing painful urination.   Bleeding after sexual intercourse.  RELATED COMPLICATIONS  Pelvic inflammatory disease.   Infection of the uterus (endometritis).   Infertility.   Tubal (ectopic) pregnancy.   It can be associated with other STDs, including gonorrhea and chlamydia, hepatitis B, and HIV.  COMPLICATIONS DURING PREGNANCY  Early (premature) delivery.   Premature rupture of the membranes (PROM).   Low birth weight.  DIAGNOSIS   Visualization of Trichomonas under the microscope from the vagina discharge.   Ph of the vagina greater than 4.5, tested with a test tape.   Trich Rapid Test.   Culture of the organism, but this is not usually needed.   It may be found on a Pap test.   Having a "strawberry cervix,"which means the cervix looks very red like a strawberry.  TREATMENT   You may be given medication to fight the infection. Inform your caregiver if you could be or are pregnant. Some medications used to treat the infection should not be taken during pregnancy.    Over-the-counter medications or creams to decrease itching or irritation may be recommended.   Your sexual partner will need to be treated if infected.  HOME CARE INSTRUCTIONS   Take all medication prescribed by your caregiver.   Take over-the-counter medication for itching or irritation as directed by your caregiver.   Do not have sexual intercourse while you have the infection.   Do not douche or wear tampons.   Discuss your infection with your partner, as your partner may have acquired the infection from you. Or, your partner may have been the person who transmitted the infection to you.   Have your sex partner examined and treated if necessary.   Practice safe, informed, and protected sex.   See your caregiver for other STD testing.  SEEK MEDICAL CARE IF:   You still have symptoms after you finish the medication.   You have an oral temperature above 102 F (38.9 C).   You develop belly (abdominal) pain.   You have pain when you urinate.   You have bleeding after sexual intercourse.   You develop a rash.   The medication makes you sick or makes you throw up (vomit).  Document Released: 05/14/2001 Document Revised: 11/07/2011 Document Reviewed: 06/09/2009 [facility] Patient Information 2012 [place], [place].Trichomoniasis Trichomoniasis is an infection, caused by the Trichomonas organism, that affects both women and men. In women, the outer female genitalia and the vagina are affected. In men, the penis is mainly affected, but the prostate and other reproductive organs can also be involved. Trichomoniasis is a sexually transmitted disease (STD)  and is most often passed to another person through sexual contact. The majority of people who get trichomoniasis do so from a sexual encounter and are also at risk for other STDs. CAUSES   Sexual intercourse with an infected partner.   It can be present in swimming pools or hot tubs.  SYMPTOMS   Abnormal gray-green frothy  vaginal discharge in women.   Vaginal itching and irritation in women.   Itching and irritation of the area outside the vagina in women.   Penile discharge with or without pain in males.   Inflammation of the urethra (urethritis), causing painful urination.   Bleeding after sexual intercourse.  RELATED COMPLICATIONS  Pelvic inflammatory disease.   Infection of the uterus (endometritis).   Infertility.   Tubal (ectopic) pregnancy.   It can be associated with other STDs, including gonorrhea and chlamydia, hepatitis B, and HIV.  COMPLICATIONS DURING PREGNANCY  Early (premature) delivery.   Premature rupture of the membranes (PROM).   Low birth weight.  DIAGNOSIS   Visualization of Trichomonas under the microscope from the vagina discharge.   Ph of the vagina greater than 4.5, tested with a test tape.   Trich Rapid Test.   Culture of the organism, but this is not usually needed.   It may be found on a Pap test.   Having a "strawberry cervix,"which means the cervix looks very red like a strawberry.  TREATMENT   You may be given medication to fight the infection. Inform your caregiver if you could be or are pregnant. Some medications used to treat the infection should not be taken during pregnancy.   Over-the-counter medications or creams to decrease itching or irritation may be recommended.   Your sexual partner will need to be treated if infected.  HOME CARE INSTRUCTIONS   Take all medication prescribed by your caregiver.   Take over-the-counter medication for itching or irritation as directed by your caregiver.   Do not have sexual intercourse while you have the infection.   Do not douche or wear tampons.   Discuss your infection with your partner, as your partner may have acquired the infection from you. Or, your partner may have been the person who transmitted the infection to you.   Have your sex partner examined and treated if necessary.   Practice  safe, informed, and protected sex.   See your caregiver for other STD testing.  SEEK MEDICAL CARE IF:   You still have symptoms after you finish the medication.   You have an oral temperature above 102 F (38.9 C).   You develop belly (abdominal) pain.   You have pain when you urinate.   You have bleeding after sexual intercourse.   You develop a rash.   The medication makes you sick or makes you throw up (vomit).  Document Released: 05/14/2001 Document Revised: 11/07/2011 Document Reviewed: 06/09/2009 [facility] Patient Information 2012 [place], [place].

## 2012-02-16 NOTE — ED Provider Notes (Signed)
History     CSN: [id number]  Arrival date & time 02/16/12  1316   First MD Initiated Contact with Patient 02/16/12 1408      No chief complaint on file.   (Consider location/radiation/quality/duration/timing/severity/associated sxs/prior treatment) HPI Comments: Patient presents to urgent care today describing that her female partner was diagnosed with trichomoniasis they have been in a monogamous relationship for years and it was dissected and a screening test. Patient describes that she has no symptoms whatsoever, and is requesting to be treated and screen for other STDs as well. No dysuria  The history is provided by the patient.    History reviewed. No pertinent past medical history.  History reviewed. No pertinent past surgical history.  No family history on file.  History  Substance Use Topics  . Smoking status: Not on file  . Smokeless tobacco: Not on file  . Alcohol Use: Not on file    OB History    Grav Para Term Preterm Abortions TAB SAB Ect Mult Living                  Review of Systems  Constitutional: Negative for fever, activity change, appetite change and fatigue.  HENT: Negative for congestion, facial swelling and rhinorrhea.   Respiratory: Negative for cough and shortness of breath.   Genitourinary: Negative for dysuria, vaginal bleeding, vaginal discharge, vaginal pain and pelvic pain.    Allergies  Penicillins  Home Medications  No current outpatient prescriptions on file.  BP 111/79  Pulse 77  Temp(Src) 98.8 F (37.1 C) (Oral)  Resp 17  SpO2 97%  Physical Exam  Vitals reviewed. Constitutional: She appears well-developed and well-nourished.  HENT:  Head: Normocephalic.  Eyes: Conjunctivae are normal.  Neck: Neck supple.  Abdominal: Soft. She exhibits no distension. There is no splenomegaly or hepatomegaly. There is no tenderness. There is no rebound and no guarding.  Genitourinary: Vagina normal. No tenderness or bleeding around the  vagina. No foreign body around the vagina. No vaginal discharge found.  Neurological: She is alert.  Skin: Skin is warm and intact. No abrasion and no rash noted.    ED Course  Procedures (including critical care time)  Labs Reviewed  WET PREP, GENITAL - Abnormal; Notable for the following:    Clue Cells Wet Prep HPF POC FEW (*)    All other components within normal limits  POCT PREGNANCY, URINE  GC/CHLAMYDIA PROBE AMP, GENITAL   No results found.   1. Trichomoniasis, unspecified       MDM  Patient asymptomatic partner was recently diagnosed with trichomoniasis requesting to be treated and screen        Jimmie Molly, MD 02/16/12 1824

## 2012-07-06 ENCOUNTER — Encounter (HOSPITAL_COMMUNITY): Payer: Self-pay | Admitting: Emergency Medicine

## 2012-07-06 ENCOUNTER — Emergency Department (HOSPITAL_COMMUNITY)
Admission: EM | Admit: 2012-07-06 | Discharge: 2012-07-06 | Disposition: A | Discharge status: Home / Self Care | Payer: Self-pay | Source: Home / Self Care | Attending: Emergency Medicine | Admitting: Emergency Medicine

## 2012-07-06 DIAGNOSIS — A499 Bacterial infection, unspecified: Secondary | ICD-10-CM

## 2012-07-06 DIAGNOSIS — N760 Acute vaginitis: Secondary | ICD-10-CM

## 2012-07-06 DIAGNOSIS — B9689 Other specified bacterial agents as the cause of diseases classified elsewhere: Secondary | ICD-10-CM

## 2012-07-06 HISTORY — DX: Acute vaginitis: N76.0

## 2012-07-06 HISTORY — DX: Urinary tract infection, site not specified: N39.0

## 2012-07-06 HISTORY — DX: Other specified bacterial agents as the cause of diseases classified elsewhere: B96.89

## 2012-07-06 LAB — POCT URINALYSIS DIP (DEVICE)
Bilirubin Urine: NEGATIVE
Hgb urine dipstick: NEGATIVE
Leukocytes, UA: NEGATIVE
Nitrite: NEGATIVE
Urobilinogen, UA: 0.2 mg/dL (ref 0.0–1.0)
pH: 5.5 (ref 5.0–8.0)

## 2012-07-06 LAB — WET PREP, GENITAL: Trich, Wet Prep: NONE SEEN

## 2012-07-06 MED ORDER — METRONIDAZOLE 500 MG PO TABS: 500.0000 mg | ORAL_TABLET | Freq: Two times a day (BID) | ORAL | Status: DC

## 2012-07-06 MED ORDER — METRONIDAZOLE 500 MG PO TABS: 500.0000 mg | ORAL_TABLET | Freq: Two times a day (BID) | ORAL | Status: AC

## 2012-07-06 NOTE — ED Provider Notes (Signed)
History     CSN: [id number]  Arrival date & time 07/06/12  1625   First MD Initiated Contact with Patient 07/06/12 1648      Chief Complaint  Patient presents with  . Urinary Tract Infection    (Consider location/radiation/quality/duration/timing/severity/associated sxs/prior treatment) HPI Comments: Pt with several days of vaginal odor, lower abdominal pressure after she urinates. She states she just finished menses.  No urgency, frequency, dysuria, oderous urine, hematuria,  genital blisters, vaginal itching. No fevers, N/V, abd pain, back pain. No recent abx use. Pt sexually active with new female partner who is asxatic. Does not use condoms. STD's not a concern today. Similar sx before when had BV. No history of gonorrhea chlamydia trichmonoas yeast infection. No h/o syphilis, herpes, HIV.  Patient has tested negative for gonorrhea and chlamydia, yeast, trichomonas twice since 2011, had BV in March 2013.  Patient is a 41 y.o. female presenting with dysuria. The history is provided by the patient. No language interpreter was used.  Dysuria     History reviewed. No pertinent past medical history.  Past Surgical History  Procedure Date  . Appendectomy     No family history on file.  History  Substance Use Topics  . Smoking status: Current Everyday Smoker  . Smokeless tobacco: Not on file  . Alcohol Use: No    OB History    Grav Para Term Preterm Abortions TAB SAB Ect Mult Living                  Review of Systems  Genitourinary: Positive for dysuria.    Allergies  Penicillins  Home Medications   Current Outpatient Rx  Name Route Sig Dispense Refill  . METRONIDAZOLE 500 MG PO TABS Oral Take 1 tablet (500 mg total) by mouth 2 (two) times daily. X 7 days 14 tablet 0    BP 118/69  Pulse 72  Temp 98.1 F (36.7 C) (Oral)  Resp 16  SpO2 98%  LMP 06/27/2012  Physical Exam  Nursing note and vitals reviewed. Constitutional: She is oriented to person, place,  and time. She appears well-developed and well-nourished. No distress.  HENT:  Head: Normocephalic and atraumatic.  Eyes: Conjunctivae and EOM are normal.  Neck: Normal range of motion.  Cardiovascular: Normal rate.   Pulmonary/Chest: Effort normal.  Abdominal: Soft. Bowel sounds are normal. She exhibits no distension. There is no tenderness. There is no rebound, no guarding and no CVA tenderness.  Genitourinary: Uterus normal. Pelvic exam was performed with patient supine. There is no rash on the right labia. There is no rash on the left labia. Uterus is not tender. Cervix exhibits no motion tenderness and no friability. Right adnexum displays no mass, no tenderness and no fullness. Left adnexum displays no mass, no tenderness and no fullness. No erythema, tenderness or bleeding around the vagina. No foreign body around the vagina. Vaginal discharge found.       Thin  white oderous  vaginal d/c.Chaperone present during exam  Musculoskeletal: Normal range of motion.  Neurological: She is alert and oriented to person, place, and time. Gait normal.  Skin: Skin is warm and dry. No rash noted.  Psychiatric: She has a normal mood and affect. Her speech is normal and behavior is normal. Judgment and thought content normal.    ED Course  Procedures (including critical care time)  Labs Reviewed  WET PREP, GENITAL - Abnormal; Notable for the following:    Clue Cells Wet Prep HPF POC MANY (*)  WBC, Wet Prep HPF POC MANY (*)     All other components within normal limits  POCT URINALYSIS DIP (DEVICE)  POCT PREGNANCY, URINE  GC/CHLAMYDIA PROBE AMP, GENITAL   No results found.   1. Bacterial vaginosis     MDM  Previous labs reviewed. As noted in history of present illness.  H&P most c/w BV. Sent off GC/chlamydia, wet prep. Will not treat empirically now.  Will send home with flagyl. Advised pt to refrain from sexual contact until she knows lab results, symptoms resolve, and partner(s) are  treated. Pt provided working phone number. Pt agrees.     Luiz Blare, MD 07/06/12 2102

## 2012-07-06 NOTE — ED Notes (Signed)
approx a month ago remembers noticing the "ammonia" smell to urine.  This seemed to improve and now has pressure in lower abdomen, particularly at end of stream.  Denies vaginal discharge .  Last bm was today, normal per patient.  Denies back pain.

## 2012-07-07 LAB — GC/CHLAMYDIA PROBE AMP, GENITAL
Chlamydia, DNA Probe: NEGATIVE
GC Probe Amp, Genital: NEGATIVE

## 2012-07-07 NOTE — ED Notes (Signed)
GC/Chlamydia neg., Wet prep: many clue cells, many WBC's.  Pt. adequately treated with Flagyl. Vassie Moselle 07/07/2012

## 2013-03-29 DIAGNOSIS — O020 Blighted ovum and nonhydatidiform mole: Secondary | ICD-10-CM | POA: Diagnosis present

## 2013-03-31 ENCOUNTER — Encounter (HOSPITAL_COMMUNITY): Payer: Self-pay | Admitting: *Deleted

## 2013-03-31 ENCOUNTER — Inpatient Hospital Stay (HOSPITAL_COMMUNITY)
Admission: AD | Admit: 2013-03-31 | Discharge: 2013-03-31 | Disposition: A | Discharge status: Home / Self Care | Payer: BC Managed Care – PPO | Source: Ambulatory Visit | Attending: Obstetrics and Gynecology | Admitting: Obstetrics and Gynecology

## 2013-03-31 DIAGNOSIS — O020 Blighted ovum and nonhydatidiform mole: Secondary | ICD-10-CM | POA: Diagnosis present

## 2013-03-31 DIAGNOSIS — R109 Unspecified abdominal pain: Secondary | ICD-10-CM | POA: Insufficient documentation

## 2013-03-31 DIAGNOSIS — IMO0002 Reserved for concepts with insufficient information to code with codable children: Secondary | ICD-10-CM

## 2013-03-31 DIAGNOSIS — O209 Hemorrhage in early pregnancy, unspecified: Secondary | ICD-10-CM | POA: Insufficient documentation

## 2013-03-31 DIAGNOSIS — O09529 Supervision of elderly multigravida, unspecified trimester: Secondary | ICD-10-CM | POA: Insufficient documentation

## 2013-03-31 DIAGNOSIS — O039 Complete or unspecified spontaneous abortion without complication: Secondary | ICD-10-CM | POA: Diagnosis present

## 2013-03-31 HISTORY — DX: Complete or unspecified spontaneous abortion without complication: O03.9

## 2013-03-31 HISTORY — DX: Blighted ovum and nonhydatidiform mole: O02.0

## 2013-03-31 LAB — ABO/RH: ABO/RH(D): A POS

## 2013-03-31 LAB — TYPE AND SCREEN
ABO/RH(D): A POS
Antibody Screen: NEGATIVE

## 2013-03-31 LAB — CBC
HCT: 34.8 % — ABNORMAL LOW (ref 36.0–46.0)
Hemoglobin: 11.9 g/dL — ABNORMAL LOW (ref 12.0–15.0)
RBC: 3.94 MIL/uL (ref 3.87–5.11)
WBC: 16.5 10*3/uL — ABNORMAL HIGH (ref 4.0–10.5)

## 2013-03-31 LAB — HCG, QUANTITATIVE, PREGNANCY: hCG, Beta Chain, Quant, S: 2007 m[IU]/mL — ABNORMAL HIGH (ref <5)

## 2013-03-31 MED ORDER — IBUPROFEN 600 MG PO TABS: 600.0000 mg | ORAL_TABLET | Freq: Four times a day (QID) | ORAL | Status: DC | PRN

## 2013-03-31 MED ORDER — HYDROMORPHONE HCL PF 2 MG/ML IJ SOLN
2.0000 mg | Freq: Once | INTRAMUSCULAR | Status: DC
  Administered 2013-03-31: 2 mg via INTRAMUSCULAR
  Filled 2013-03-31: qty 1

## 2013-03-31 MED ORDER — OXYCODONE-ACETAMINOPHEN 5-325 MG PO TABS: 1.0000 | ORAL_TABLET | ORAL | Status: DC | PRN

## 2013-03-31 MED ORDER — SODIUM CHLORIDE 0.9 % IV BOLUS (SEPSIS)
500.0000 mL | Freq: Once | INTRAVENOUS | Status: AC
  Administered 2013-03-31: 1000 mL via INTRAVENOUS

## 2013-03-31 MED ORDER — HYDROMORPHONE HCL PF 1 MG/ML IJ SOLN: 2.0000 mg | Freq: Once | INTRAMUSCULAR | Status: DC

## 2013-03-31 MED ORDER — OXYCODONE-ACETAMINOPHEN 5-325 MG PO TABS: 2.0000 | ORAL_TABLET | Freq: Once | ORAL | Status: DC

## 2013-03-31 NOTE — MAU Note (Signed)
Pt states that she started spotting yesterday morning-it got heavy around 0200 with a lot of cramping

## 2013-03-31 NOTE — MAU Note (Signed)
Pt sttes she had an [place] at [facility] and was told she has a blighted ovum

## 2013-03-31 NOTE — MAU Provider Note (Signed)
History   42 yo [phone] presented with a known 8 week blighted ovum, having heavy bleeding and cramping that began this am.  She was seen at [facility] on 4/29 for spotting, with blighted ovum dx by [place] and QHCG of 3708.2.  SAB precautions were reviewed, and plans made to follow [facility] weekly.  A+ per previous records and repeat today.  On presentation to MAU, patient was seen emergently by [place], CNM, and was noted to have heavy bleeding and products of conception at the cervical os.  These were extracted from the os and vagina by [place], with cessation of cramping and diminishing of bleeding.  See [place]'s notes for further information.  Patient currently having no cramping and a small amount of vaginal bleeding.  She denies nausea, vomiting, dizziness, or syncope with sitting up.  POC sent to Pathology by Dorathy Kinsman, CNM.  Chief Complaint  Patient presents with  . Vaginal Bleeding     OB History   Grav Para Term Preterm Abortions TAB SAB Ect Mult Living   9 4 3 1 4  4   3       Past Medical History  Diagnosis Date  . BV (bacterial vaginosis)   . UTI (lower urinary tract infection)   . Blighted ovum 03/31/2013    GS on u/s at [facility] 03/29/13 w/ no ys and no fetal pole, measuring [redacted]w[redacted]d (vs [redacted]w[redacted]d LMP); seen by EP, P.A. 03/29/13 for VB  . Spontaneous miscarriage 03/31/2013    Past Surgical History  Procedure Laterality Date  . Appendectomy    . Cesarean section      History reviewed. No pertinent family history.  History  Substance Use Topics  . Smoking status: Current Every Day Smoker  . Smokeless tobacco: Not on file  . Alcohol Use: No    Allergies:  Allergies  Allergen Reactions  . Penicillins     No prescriptions prior to admission     Physical Exam   Blood pressure 104/50, pulse 97, temperature 98.7 F (37.1 C), resp. rate 20, height 5\' 5"  (1.651 m), weight 225 lb (102.059 kg), last menstrual period 01/09/2013, SpO2 100.00%.  Chest clear Heart RRR  without murmur Abd soft, NT Uterus small, NT Pelvic--no active bleeding at present, small amount blood on pad. Ext WNL  Results for orders placed during the hospital encounter of 03/31/13 (from the past 24 hour(s))  HCG, QUANTITATIVE, PREGNANCY     Status: Abnormal   Collection Time    03/31/13  6:38 AM      Result Value Range   hCG, Beta Chain, Quant, S 2007 (*) <5 mIU/mL  TYPE AND SCREEN     Status: None   Collection Time    03/31/13  6:40 AM      Result Value Range   ABO/RH(D) A POS     Antibody Screen NEG     Sample Expiration 04/03/2013    CBC     Status: Abnormal   Collection Time    03/31/13  6:45 AM      Result Value Range   WBC 16.5 (*) 4.0 - 10.5 K/uL   RBC 3.94  3.87 - 5.11 MIL/uL   Hemoglobin 11.9 (*) 12.0 - 15.0 g/dL   HCT 45.4 (*) 09.8 - 11.9 %   MCV 88.3  78.0 - 100.0 fL   MCH 30.2  26.0 - 34.0 pg   MCHC 34.2  30.0 - 36.0 g/dL   RDW 14.7  82.9 - 56.2 %  Platelets 251  150 - 400 K/uL     ED Course  Likely complete SAB A+  Plan: Consulted with Dr. Su Hilt Will monitor patient until 9am--if stable, able to ambulate to BR without syncope, and minimal pain, will d/c home. OOW today--patient plans to go to work tomorrow if feeling OK.  Notes written separately for today and tomorrow. Bleeding precautions reviewed.  To call/return with any heavy bleeding, severe pain, fever, etc. Plan f/u visit at [facility] in 1 week for re-evaluation, repeat QHCG, [place] to r/o retained products.  Office will call patient to schedule. Support to patient for loss. Rx Motrin to patient's pharmacy and Rx Percocet to patient.   Nigel Bridgeman CNM, [place] 03/31/2013 8:02 AM

## 2013-03-31 NOTE — MAU Provider Note (Signed)
Chief Complaint: Vaginal Bleeding  First Provider Initiated Contact with Patient 03/31/13 [phone]     SUBJECTIVE HPI: Monica Walton is a 42 y.o. [id number] at 11.2 by LMP who presents with severe cramping, moderate bleeding and passage of clots since 0200. Does not think she passed tissue.   Spotting started 4/258/14. Seen at [facility] later that day. US showed 8.2 weeks GS. No FP or YS. Blood type A pos per previous records.    Past Medical History  Diagnosis Date  . BV (bacterial vaginosis)   . UTI (lower urinary tract infection)    OB History   Grav Para Term Preterm Abortions TAB SAB Ect Mult Living   9 3  1 4  4   3      # Outc Date GA Lbr Len/2nd Wgt Sex Del Anes PTL Lv   1 GRA            2 PRE            3 PAR            4 PAR            5 SAB            6 SAB            7 SAB            8 SAB            9 CUR              Past Surgical History  Procedure Laterality Date  . Appendectomy     History   Social History  . Marital Status: Single    Spouse Name: N/A    Number of Children: N/A  . Years of Education: N/A   Occupational History  . Not on file.   Social History Main Topics  . Smoking status: Current Every Day Smoker  . Smokeless tobacco: Not on file  . Alcohol Use: No  . Drug Use: No  . Sexually Active: Yes    Birth Control/ Protection: None   Other Topics Concern  . Not on file   Social History Narrative  . No narrative on file   No current facility-administered medications on file prior to encounter.   No current outpatient prescriptions on file prior to encounter.   Allergies  Allergen Reactions  . Penicillins     ROS: Neg for fever, chills.   OBJECTIVE Blood pressure 112/67, pulse 70, temperature 97.8 F (36.6 C), temperature source Oral, resp. rate 20, height 5\' 5"  (1.651 m), weight 102.059 kg (225 lb), last menstrual period 01/09/2013, SpO2 100.00%. GENERAL: Well-developed, well-nourished female in severe distress, crying, rocking.   HEENT: Normocephalic HEART: normal rate RESP: normal effort ABDOMEN: Soft, non-tender EXTREMITIES: Nontender, no edema NEURO: Alert and oriented SPECULUM EXAM: NEFG, large amount of bright red blood with multiple clots blood noted, possible GS partially protruding from cervix. Tissue grasped w/ ring forceps. Several small pieces removed initially followed by removal of large, intact 4x6 cm piece. Bleeding decrease soon after. Small to moderate bleeding to follow. Saturated 17 Fox swabs and additional ~250-300 mls of blood and clots onto pad. BIMANUAL: cervix 1 cm; UTA uterine size due to body habitus, no adnexal tenderness or masses  LAB RESULTS  IMAGING  MAU COURSE Bleeding small, then moderate clot passed. RNs unable to start IV. Labs drawn by phlebotomy. CRNA called to start IV. Pain resolved w/ IM Dilaudid and removal  of tissue.   Care of pt turned over to [facility], CNM at [phone].   [place], CNM 03/31/2013  7:04 AM

## 2013-05-21 ENCOUNTER — Other Ambulatory Visit: Payer: Self-pay

## 2013-05-21 DIAGNOSIS — Z1231 Encounter for screening mammogram for malignant neoplasm of breast: Secondary | ICD-10-CM

## 2013-05-28 ENCOUNTER — Ambulatory Visit
Admission: RE | Admit: 2013-05-28 | Discharge: 2013-05-28 | Disposition: A | Discharge status: Home / Self Care | Payer: BC Managed Care – PPO | Source: Ambulatory Visit

## 2013-05-28 DIAGNOSIS — Z1231 Encounter for screening mammogram for malignant neoplasm of breast: Secondary | ICD-10-CM

## 2013-10-07 ENCOUNTER — Other Ambulatory Visit: Payer: Self-pay

## 2013-10-12 ENCOUNTER — Other Ambulatory Visit: Payer: Self-pay | Admitting: Obstetrics & Gynecology

## 2013-12-23 ENCOUNTER — Other Ambulatory Visit: Payer: Self-pay | Admitting: Internal Medicine

## 2013-12-23 ENCOUNTER — Ambulatory Visit
Admission: RE | Admit: 2013-12-23 | Discharge: 2013-12-23 | Disposition: A | Discharge status: Home / Self Care | Payer: BC Managed Care – PPO | Source: Ambulatory Visit | Attending: Internal Medicine | Admitting: Internal Medicine

## 2013-12-23 DIAGNOSIS — M545 Low back pain, unspecified: Secondary | ICD-10-CM

## 2014-03-01 ENCOUNTER — Other Ambulatory Visit: Payer: Self-pay

## 2014-03-01 DIAGNOSIS — Z1231 Encounter for screening mammogram for malignant neoplasm of breast: Secondary | ICD-10-CM

## 2014-05-30 ENCOUNTER — Ambulatory Visit
Admission: RE | Admit: 2014-05-30 | Discharge: 2014-05-30 | Disposition: A | Discharge status: Home / Self Care | Payer: BC Managed Care – PPO | Source: Ambulatory Visit

## 2014-05-30 DIAGNOSIS — Z1231 Encounter for screening mammogram for malignant neoplasm of breast: Secondary | ICD-10-CM

## 2014-10-24 ENCOUNTER — Encounter (HOSPITAL_COMMUNITY): Payer: Self-pay | Admitting: *Deleted

## 2014-10-25 NOTE — H&P (Signed)
Monica Walton is an 43 y.o. female.  Monica Walton is a 43y [id number] who presents management of her HMB and dysmenorrhea by hysteroscopy, D&C, Novasure ablation. In review, she has been on the Loestrin with some relief, but wishes for a more permanent option to regulate her bleeding. Menses are regular every month with 7-10 days of heavy bleeding soaking through pads and causing occasional accidents. +Dysmenorrhea.        Gyn History:  Sexual activity currently sexually active.  Periods : every month, heavy blood loss.  LMP 09/21/14.  Birth control ocp.  Last pap smear date 05/2013.  Last mammogram date 05/2013.          OB History:  Pregnancy # 1 3 abortions.  Pregnancy # 2 deceased, induced labor, vaginal delivery, boy.  Pregnancy # 3 miscarriage.  Pregnancy # 4: live birth, vaginal delivery, boy .  Pregnancy # 5: Live Birth, vaginal delivery, Boy.  Pregnancy # 6 live birth, C-section, boy.        Surgical History: Appendix (age 89) , C- section 2003.        Hospitalization/Major Diagnostic Procedure: Surgeries and Childbirth .        Family History: Father: deceased, suicideMother: alive, HealthyMaternal Grand Father: deceased, diagnosed with Lung CaMaternal Grand Mother: alive, diagnosed with DM denies any GYN family cancer hx No FH of colon, breast or colon cancer.       Social History:  General:  Tobacco use  cigarettes: Former smoker Quit in year 2012 Tobacco history last updated 06/21/2014 no Smoking.  no Alcohol.  no Recreational drug use.  Exercise: yes, Gym (cardio) daily.  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  Marital Status: single, committed relationship.  Children: 3 sons.       Allergies:  Allergies  Allergen Reactions  . Penicillins     Childhood rxn   Medications: Combined birth control pill-   Review of Systems  Constitutional: Negative for fever and chills.  Eyes: Negative.   Respiratory: Negative for cough.    Cardiovascular: Negative for chest pain and palpitations.  Gastrointestinal: Negative for heartburn, nausea, vomiting, diarrhea and constipation.  Genitourinary: Negative for dysuria, urgency and frequency.  Musculoskeletal: Negative.   Skin: Negative for itching and rash.  Neurological: Negative.  Negative for headaches.  Endo/Heme/Allergies: Negative.   Psychiatric/Behavioral: Negative.     Physical Exam Performed in office GENERAL APPEARANCE alert, oriented, NAD, pleasant.  SKIN: normal, no rash.  CV: RRR Lungs: CTAB ABDOMEN: soft and not tender, no guarding, no rebound, no rigidity, no masses palpated.  FEMALE GENITOURINARY: normal external genitalia, labia - unremarkable, cervix - no discharge or lesions or CMT, adnexa - no masses or tenderness, uterus - nontender and normal size on palpation.  Ext: No edema, no calf tenderness bilaterally  LABS: US performed 10/03/14:showed 8.4x5.8x4.8cm normal size and shaped uterus. Normal trilayered endometrium. Both ovaries unremarkable.  Prior EMB performed November of last year was negative.   Assessment/Plan: 84ON [id number] who presents for hysteroscopy, D&C, Novasure ablation due to heavy menstrual bleeding. -NPO -LR @ 125cc/hr -Ancef 2g IV to OR -SCDs to OR -Risk, benefits and potential complications reviewed with patient in office- questions and concerns were addressed.  Monica Walton, Monica Walton 10/25/2014, 9:21 AM

## 2014-11-04 ENCOUNTER — Encounter (HOSPITAL_COMMUNITY): Payer: Self-pay | Admitting: Anesthesiology

## 2014-11-04 ENCOUNTER — Ambulatory Visit (HOSPITAL_COMMUNITY): Payer: BC Managed Care – PPO | Admitting: Certified Registered"

## 2014-11-04 ENCOUNTER — Encounter (HOSPITAL_COMMUNITY)
Admission: RE | Disposition: A | Discharge status: Home / Self Care | Payer: Self-pay | Source: Ambulatory Visit | Attending: Obstetrics & Gynecology

## 2014-11-04 ENCOUNTER — Ambulatory Visit (HOSPITAL_COMMUNITY)
Admission: RE | Admit: 2014-11-04 | Discharge: 2014-11-04 | Disposition: A | Discharge status: Home / Self Care | Payer: BC Managed Care – PPO | Source: Ambulatory Visit | Attending: Obstetrics & Gynecology | Admitting: Obstetrics & Gynecology

## 2014-11-04 DIAGNOSIS — Z87891 Personal history of nicotine dependence: Secondary | ICD-10-CM | POA: Diagnosis not present

## 2014-11-04 DIAGNOSIS — E669 Obesity, unspecified: Secondary | ICD-10-CM | POA: Insufficient documentation

## 2014-11-04 DIAGNOSIS — Z880 Allergy status to penicillin: Secondary | ICD-10-CM | POA: Insufficient documentation

## 2014-11-04 DIAGNOSIS — N858 Other specified noninflammatory disorders of uterus: Secondary | ICD-10-CM | POA: Diagnosis not present

## 2014-11-04 DIAGNOSIS — N920 Excessive and frequent menstruation with regular cycle: Secondary | ICD-10-CM | POA: Insufficient documentation

## 2014-11-04 HISTORY — PX: DILITATION & CURRETTAGE/HYSTROSCOPY WITH NOVASURE ABLATION: SHX5568

## 2014-11-04 LAB — PREGNANCY, URINE: PREG TEST UR: NEGATIVE

## 2014-11-04 LAB — BASIC METABOLIC PANEL
Anion gap: 10 (ref 5–15)
BUN: 8 mg/dL (ref 6–23)
CALCIUM: 9.6 mg/dL (ref 8.4–10.5)
CO2: 29 mEq/L (ref 19–32)
Chloride: 100 mEq/L (ref 96–112)
Creatinine, Ser: 0.69 mg/dL (ref 0.50–1.10)
GFR calc non Af Amer: >90 mL/min (ref >90)
GLUCOSE: 88 mg/dL (ref 70–99)
POTASSIUM: 4.1 meq/L (ref 3.7–5.3)
SODIUM: 139 meq/L (ref 137–147)

## 2014-11-04 LAB — CBC
HEMATOCRIT: 41 % (ref 36.0–46.0)
HEMOGLOBIN: 13.9 g/dL (ref 12.0–15.0)
MCH: 30.6 pg (ref 26.0–34.0)
MCHC: 33.9 g/dL (ref 30.0–36.0)
MCV: 90.3 fL (ref 78.0–100.0)
Platelets: 319 10*3/uL (ref 150–400)
RBC: 4.54 MIL/uL (ref 3.87–5.11)
RDW: 13 % (ref 11.5–15.5)
WBC: 11.7 10*3/uL — ABNORMAL HIGH (ref 4.0–10.5)

## 2014-11-04 SURGERY — DILATATION & CURETTAGE/HYSTEROSCOPY WITH NOVASURE ABLATION
Anesthesia: General

## 2014-11-04 MED ORDER — LACTATED RINGERS IV SOLN: INTRAVENOUS | Status: DC

## 2014-11-04 MED ORDER — MIDAZOLAM HCL 2 MG/2ML IJ SOLN
INTRAMUSCULAR | Status: DC | PRN
  Administered 2014-11-04: 2 mg via INTRAVENOUS

## 2014-11-04 MED ORDER — PROPOFOL 10 MG/ML IV BOLUS
INTRAVENOUS | Status: DC | PRN
  Administered 2014-11-04: 200 mg via INTRAVENOUS

## 2014-11-04 MED ORDER — ONDANSETRON HCL 4 MG/2ML IJ SOLN
INTRAMUSCULAR | Status: DC | PRN
  Administered 2014-11-04: 4 mg via INTRAVENOUS

## 2014-11-04 MED ORDER — FENTANYL CITRATE 0.05 MG/ML IJ SOLN
INTRAMUSCULAR | Status: DC | PRN
  Administered 2014-11-04: 100 ug via INTRAVENOUS

## 2014-11-04 MED ORDER — FENTANYL CITRATE 0.05 MG/ML IJ SOLN
25.0000 ug | INTRAMUSCULAR | Status: DC | PRN
  Administered 2014-11-04 (×2): 50 ug via INTRAVENOUS

## 2014-11-04 MED ORDER — MEPERIDINE HCL 25 MG/ML IJ SOLN: 6.2500 mg | INTRAMUSCULAR | Status: DC | PRN

## 2014-11-04 MED ORDER — CEFAZOLIN SODIUM-DEXTROSE 2-3 GM-% IV SOLR
2.0000 g | INTRAVENOUS | Status: AC
  Administered 2014-11-04: 2 g via INTRAVENOUS

## 2014-11-04 MED ORDER — OXYCODONE HCL 5 MG PO TABS: 5.0000 mg | ORAL_TABLET | Freq: Once | ORAL | Status: DC | PRN

## 2014-11-04 MED ORDER — OXYCODONE HCL 5 MG/5ML PO SOLN: 5.0000 mg | Freq: Once | ORAL | Status: DC | PRN

## 2014-11-04 MED ORDER — DEXAMETHASONE SODIUM PHOSPHATE 10 MG/ML IJ SOLN
INTRAMUSCULAR | Status: DC | PRN
  Administered 2014-11-04: 4 mg via INTRAVENOUS

## 2014-11-04 MED ORDER — OXYCODONE-ACETAMINOPHEN 5-325 MG PO TABS
2.0000 | ORAL_TABLET | Freq: Once | ORAL | Status: AC
  Administered 2014-11-04: 2 via ORAL

## 2014-11-04 MED ORDER — SCOPOLAMINE 1 MG/3DAYS TD PT72
1.0000 | MEDICATED_PATCH | Freq: Once | TRANSDERMAL | Status: DC
  Administered 2014-11-04: 1.5 mg via TRANSDERMAL

## 2014-11-04 MED ORDER — LIDOCAINE HCL (CARDIAC) 20 MG/ML IV SOLN
INTRAVENOUS | Status: DC | PRN
  Administered 2014-11-04: 50 mg via INTRAVENOUS

## 2014-11-04 MED ORDER — METOCLOPRAMIDE HCL 5 MG/ML IJ SOLN: 10.0000 mg | Freq: Once | INTRAMUSCULAR | Status: DC | PRN

## 2014-11-04 MED ORDER — LACTATED RINGERS IV SOLN
INTRAVENOUS | Status: DC
  Administered 2014-11-04: 16:00:00 via INTRAVENOUS

## 2014-11-04 MED ORDER — KETOROLAC TROMETHAMINE 30 MG/ML IJ SOLN
INTRAMUSCULAR | Status: DC | PRN
  Administered 2014-11-04: 30 mg via INTRAVENOUS

## 2014-11-04 SURGICAL SUPPLY — 16 items
ABLATOR ENDOMETRIAL BIPOLAR (ABLATOR) IMPLANT
CANISTER SUCT 3000ML (MISCELLANEOUS) ×3 IMPLANT
CATH ROBINSON RED A/P 16FR (CATHETERS) ×3 IMPLANT
CLOTH BEACON ORANGE TIMEOUT ST (SAFETY) ×3 IMPLANT
CONTAINER PREFILL 10% NBF 60ML (FORM) ×6 IMPLANT
DILATOR CANAL MILEX (MISCELLANEOUS) IMPLANT
GLOVE BIOGEL PI IND STRL 6.5 (GLOVE) ×2 IMPLANT
GLOVE BIOGEL PI INDICATOR 6.5 (GLOVE) ×4
GLOVE ECLIPSE 6.5 STRL STRAW (GLOVE) ×3 IMPLANT
GOWN STRL REUS W/TWL LRG LVL3 (GOWN DISPOSABLE) ×6 IMPLANT
PACK VAGINAL MINOR WOMEN LF (CUSTOM PROCEDURE TRAY) ×3 IMPLANT
PAD OB MATERNITY 4.3X12.25 (PERSONAL CARE ITEMS) ×3 IMPLANT
TOWEL OR 17X24 6PK STRL BLUE (TOWEL DISPOSABLE) ×6 IMPLANT
TUBING AQUILEX INFLOW (TUBING) ×3 IMPLANT
TUBING AQUILEX OUTFLOW (TUBING) ×3 IMPLANT
WATER STERILE IRR 1000ML POUR (IV SOLUTION) ×3 IMPLANT

## 2014-11-04 NOTE — Interval H&P Note (Signed)
History and Physical Interval Note:  11/04/2014 3:47 PM  Myrene GalasStephanie D Moore  has presented today for surgery, with the diagnosis of N94.6 Dysmenorrhea  The various methods of treatment have been discussed with the patient and family. After consideration of risks, benefits and other options for treatment, the patient has consented to  Procedure(s): DILATATION & CURETTAGE/HYSTEROSCOPY WITH NOVASURE ABLATION (N/A) as a surgical intervention .  The patient's history has been reviewed, patient examined, no change in status, stable for surgery.  I have reviewed the patient's chart and labs.  Questions were answered to the patient's satisfaction.     Myna HidalgoZAN, Idrees Quam, M

## 2014-11-04 NOTE — Anesthesia Postprocedure Evaluation (Signed)
  Anesthesia Post-op Note  Patient: Monica Walton  Procedure(s) Performed: Procedure(s): DILATATION & CURETTAGE/HYSTEROSCOPY WITH NOVASURE ABLATION (N/A)  Patient Location: PACU  Anesthesia Type:General  Level of Consciousness: awake, alert  and oriented  Airway and Oxygen Therapy: Patient Spontanous Breathing  Post-op Pain: mild  Post-op Assessment: Post-op Vital signs reviewed, Patient's Cardiovascular Status Stable, Respiratory Function Stable, Patent Airway, No signs of Nausea or vomiting and Pain level controlled  Post-op Vital Signs: Reviewed and stable  Last Vitals:  Filed Vitals:   11/04/14 1715  BP: 110/57  Pulse: 73  Temp:   Resp: 16    Complications: No apparent anesthesia complications

## 2014-11-04 NOTE — Discharge Instructions (Addendum)
HOME INSTRUCTIONS  Please note any unusual or excessive bleeding, pain, swelling. Mild dizziness or drowsiness are normal for about 24 hours after surgery.   Shower when comfortable  Restrictions: No driving for 24 hours or while taking pain medications.  Activity:  No heavy lifting (> 10 lbs), nothing in vagina (no tampons, douching, or intercourse) x 4 weeks; no tub baths for 4 weeks Vaginal spotting is expected but if your bleeding is heavy, period like,  please call the office   Diet:  Clear liquid Diet then advance to eat a regular diet.  Do not eat large meals.  Eat small frequent meals throughout the day.  Continue to drink a good amount of water at least 6-8 glasses of water per day, hydration is very important for the healing process.  May remove Scop patch on or before 11/06/2014.  Pain Management: Take Motrin (after 10:40 p.m.) and/or Tylenol as needed for pain  Always take prescription pain medication with food, it may cause constipation, increase fluids and fiber and you may want to take an over-the-counter stool softener like Colace as needed up to 2x a day.    Alcohol -- Avoid for 24 hours and while taking pain medications.  Nausea: Take sips of ginger ale or soda  Fever -- Call physician if temperature over 101 degrees  Follow up:  If you do not already have a follow up appointment scheduled, please call the office at [phone].  If you experience fever (a temperature greater than 100.4), pain unrelieved by pain medication, shortness of breath, swelling of a single leg, or any other symptoms which are concerning to you please the office immediately.

## 2014-11-04 NOTE — Anesthesia Preprocedure Evaluation (Signed)
Anesthesia Evaluation  Patient identified by MRN, date of birth, ID band Patient awake    Reviewed: Allergy & Precautions, H&P , NPO status , Patient's Chart, lab work & pertinent test results  Airway Mallampati: III  TM Distance: >3 FB Neck ROM: Full    Dental  (+) Poor Dentition   Pulmonary former smoker,  breath sounds clear to auscultation  Pulmonary exam normal       Cardiovascular negative cardio ROS  Rhythm:Regular Rate:Normal     Neuro/Psych PSYCHIATRIC DISORDERS negative neurological ROS     GI/Hepatic negative GI ROS, Neg liver ROS,   Endo/Other  Obesity  Renal/GU negative Renal ROS  negative genitourinary   Musculoskeletal negative musculoskeletal ROS (+)   Abdominal (+) + obese,   Peds  Hematology negative hematology ROS (+)   Anesthesia Other Findings   Reproductive/Obstetrics Menorrhagia Dysmenorrhea                             Anesthesia Physical Anesthesia Plan  ASA: II  Anesthesia Plan: General   Post-op Pain Management:    Induction: Intravenous  Airway Management Planned: LMA  Additional Equipment:   Intra-op Plan:   Post-operative Plan: Extubation in OR  Informed Consent: I have reviewed the patients History and Physical, chart, labs and discussed the procedure including the risks, benefits and alternatives for the proposed anesthesia with the patient or authorized representative who has indicated his/her understanding and acceptance.   Dental advisory given  Plan Discussed with: Anesthesiologist, CRNA and Surgeon  Anesthesia Plan Comments:         Anesthesia Quick Evaluation

## 2014-11-04 NOTE — Transfer of Care (Signed)
Immediate Anesthesia Transfer of Care Note  Patient: Monica GalasStephanie D Walton  Procedure(s) Performed: Procedure(s): DILATATION & CURETTAGE/HYSTEROSCOPY WITH NOVASURE ABLATION (N/A)  Patient Location: PACU  Anesthesia Type:General  Level of Consciousness: awake, alert  and oriented  Airway & Oxygen Therapy: Patient Spontanous Breathing O2 nasal cannula  Post-op Assessment: Report given to PACU RN and Post -op Vital signs reviewed and stable  Post vital signs: Reviewed and stable  Complications: No apparent anesthesia complications

## 2014-11-07 ENCOUNTER — Encounter (HOSPITAL_COMMUNITY): Payer: Self-pay | Admitting: Obstetrics & Gynecology

## 2014-11-07 NOTE — Op Note (Signed)
Operative Report  PreOp: Heavy menstrual bleeding PostOp: same Procedure:  Hysteroscopy, Dilation and Curettage, Endometrial ablation Surgeon: Dr. Myna HidalgoJennifer Genea Rheaume Anesthesia: General Complications:none EBL: Minimal  Findings: Anteverted 8wk sized uterus with proliferative endometrium.  Both ostia visualized. Specimens: 1) Endocervical curetting 2) endometrial curettings  Procedure: The patient was taken to the operating room where she underwent general anesthesia without difficulty. The patient was placed in a low lithotomy position using Allen stirrups. She was prepped and draped in the normal sterile fashion. The bladder was drained using a red rubber urethral catheter. A sterile speculum was inserted into the vagina. A single tooth tenaculum was placed on the anterior lip of the cervix. Endocervical curettings were obtained.  The uterus was sounded to 8cm. The endocervical canal was then serially dilated to 14French using Hank dilators.  The diagnostic hysteroscope was then inserted without difficulty and noted to have the findings as listed above. Using the hysteroscope, the cervical length was noted to be 4. Visualization was achieved using LR as a distending medium. The hysteroscope was removed and sharp curettage was performed. The tissue was sent to pathology.   Attention was then turned to the Novasure. The Novasure was set up according to manufacture instructions. The cavity length was set to 4. The Novasure was inserted, seating test performed and the cavity width was noted to be 3.4. Cavity assessment was performed and passed. The device was then activated for 120sec at a power level of 75. Upon completion, the Novasure was removed and the hysteroscope was reinserted. Global ablation was visualized and no uterine perforation was seen. All instrument were then removed. Hemostasis was obtained at the cervical site using nitrazine The patient was repositioned to the supine position. The  patient tolerated the procedure without any complications and taken to recovery in stable condition.   Myna HidalgoJennifer Minta Fair, DO [phone] (pager) [phone] (office)

## 2015-05-12 ENCOUNTER — Other Ambulatory Visit: Payer: Self-pay

## 2015-05-12 DIAGNOSIS — Z1231 Encounter for screening mammogram for malignant neoplasm of breast: Secondary | ICD-10-CM

## 2015-06-02 ENCOUNTER — Ambulatory Visit: Payer: Self-pay

## 2015-06-08 ENCOUNTER — Ambulatory Visit
Admission: RE | Admit: 2015-06-08 | Discharge: 2015-06-08 | Disposition: A | Discharge status: Home / Self Care | Payer: 59 | Source: Ambulatory Visit

## 2015-06-08 DIAGNOSIS — Z1231 Encounter for screening mammogram for malignant neoplasm of breast: Secondary | ICD-10-CM

## 2015-12-08 IMAGING — CR DG LUMBAR SPINE COMPLETE 4+V
6 series · 6 of 6 positions shown · non-contrast
Comparison: None.

CLINICAL DATA: Low back pain.

EXAM:
LUMBAR SPINE - COMPLETE 4+ VIEW

[t l-spine a.p. *]
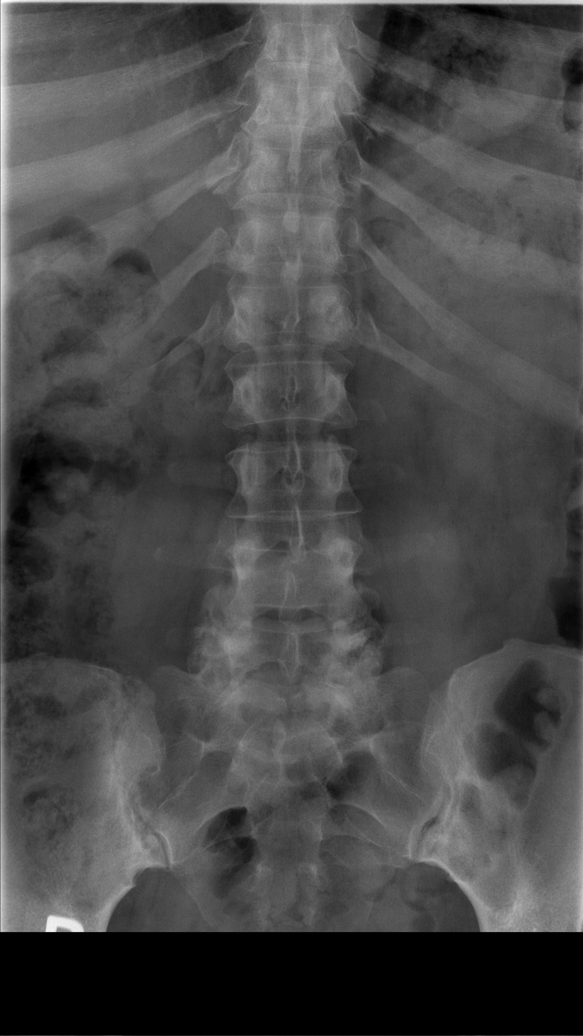

[t l-spine oblique exposure (1 of 2)]
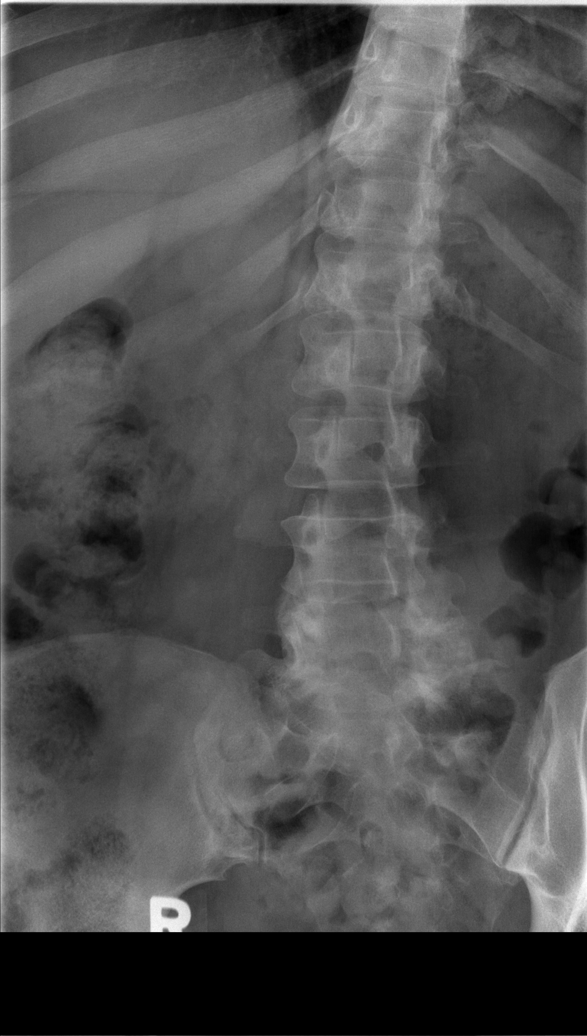

[t l-spine oblique exposure *]
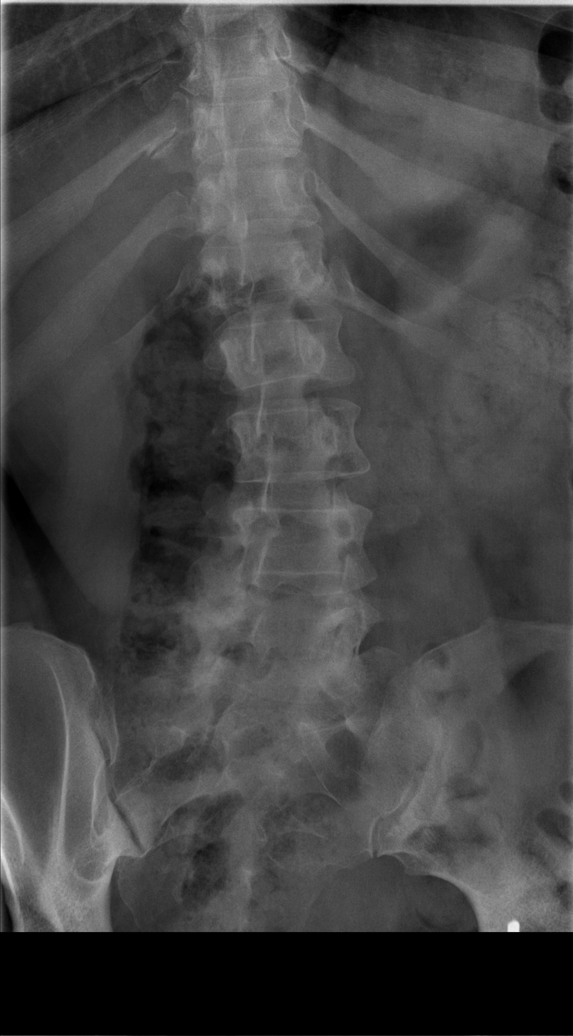

[t l-spine oblique exposure (2 of 2)]
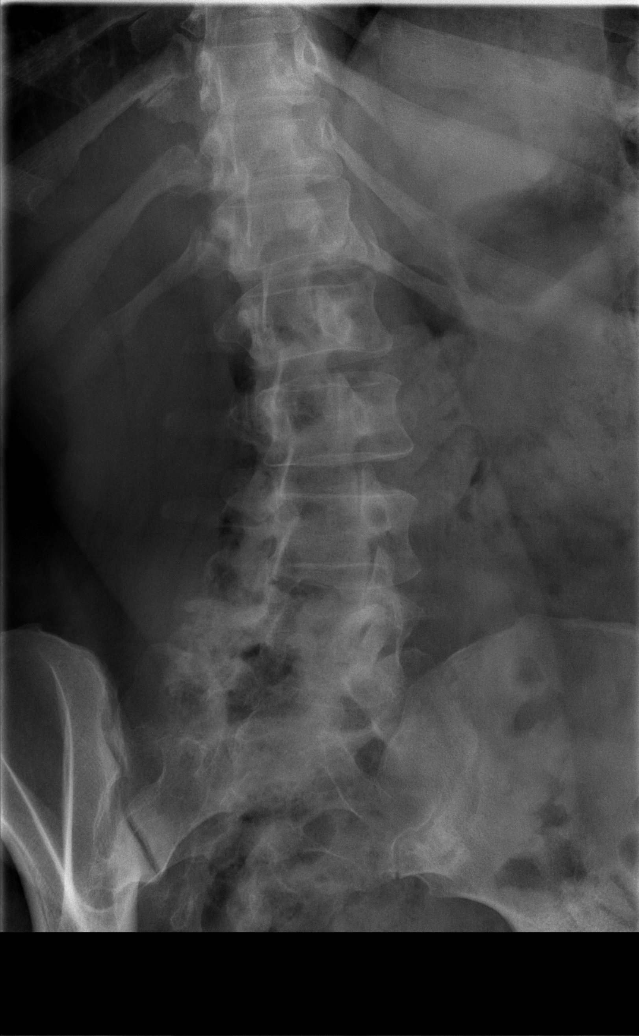

[t l-spine lat]
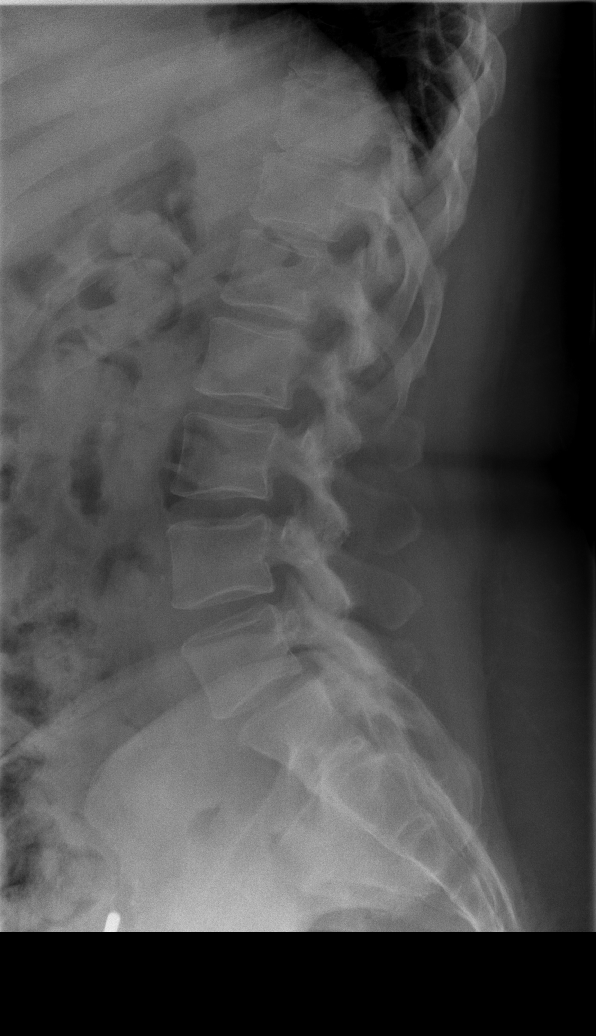

[t l-spine l5-s1 spot]
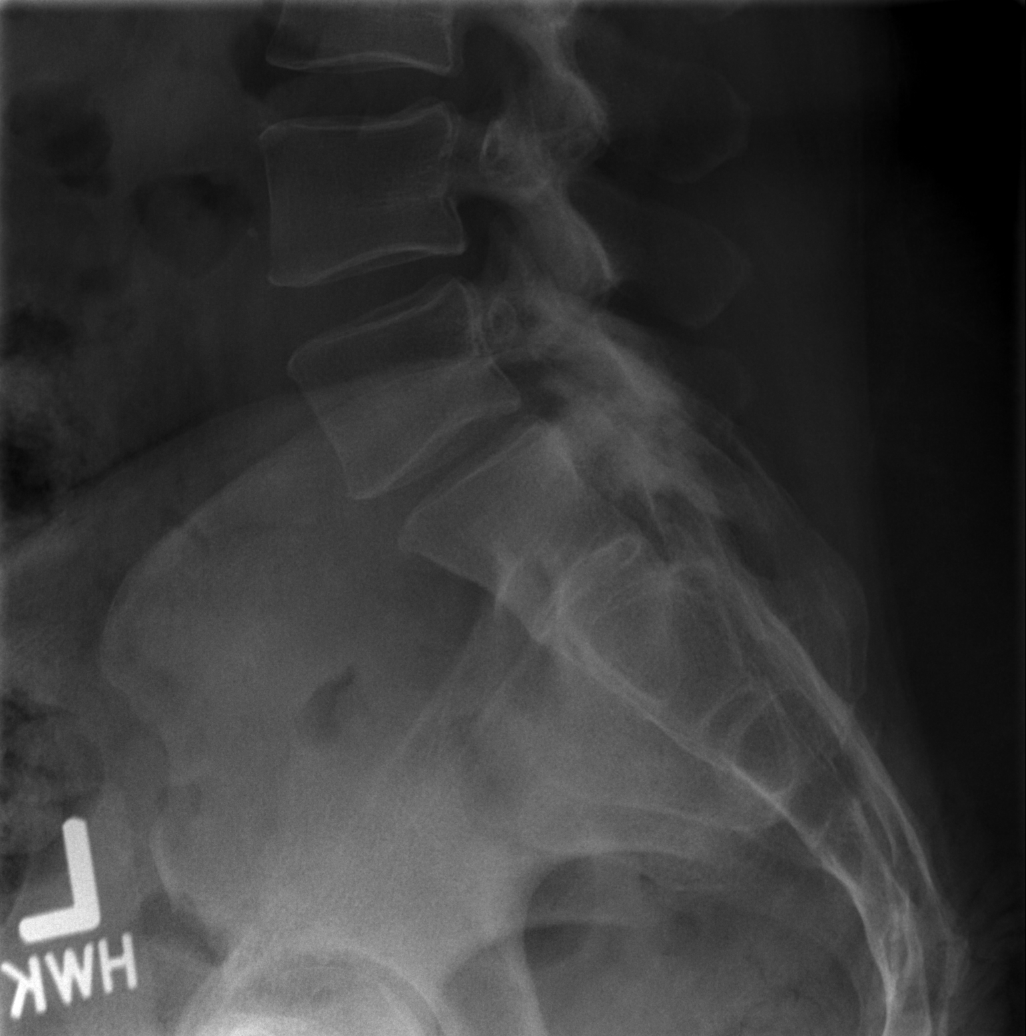

[6 of 6 positions shown; findings below may reference images not displayed]

FINDINGS: No fracture or subluxation is identified. There is facet hypertrophy
at L4-5 and L5-S1. Mild disc space narrowing is present at L5-S1. No
bony lesions are identified.
IMPRESSION: Mild lumbar spondylosis at L4-5 and L5-S1.

## 2016-05-15 ENCOUNTER — Other Ambulatory Visit: Payer: Self-pay | Admitting: Obstetrics & Gynecology

## 2016-05-15 DIAGNOSIS — Z1231 Encounter for screening mammogram for malignant neoplasm of breast: Secondary | ICD-10-CM

## 2016-06-11 ENCOUNTER — Ambulatory Visit: Payer: 59

## 2016-07-30 ENCOUNTER — Ambulatory Visit: Payer: 59

## 2016-08-07 ENCOUNTER — Ambulatory Visit
Admission: RE | Admit: 2016-08-07 | Discharge: 2016-08-07 | Disposition: A | Discharge status: Home / Self Care | Payer: 59 | Source: Ambulatory Visit | Attending: Obstetrics & Gynecology | Admitting: Obstetrics & Gynecology

## 2016-08-07 DIAGNOSIS — Z1231 Encounter for screening mammogram for malignant neoplasm of breast: Secondary | ICD-10-CM

## 2016-08-23 ENCOUNTER — Other Ambulatory Visit (HOSPITAL_COMMUNITY)
Admission: RE | Admit: 2016-08-23 | Discharge: 2016-08-23 | Disposition: A | Discharge status: Home / Self Care | Payer: 59 | Source: Ambulatory Visit | Attending: Obstetrics & Gynecology | Admitting: Obstetrics & Gynecology

## 2016-08-23 ENCOUNTER — Other Ambulatory Visit: Payer: Self-pay | Admitting: Obstetrics & Gynecology

## 2016-08-23 DIAGNOSIS — Z01411 Encounter for gynecological examination (general) (routine) with abnormal findings: Secondary | ICD-10-CM | POA: Insufficient documentation

## 2016-08-23 DIAGNOSIS — Z1151 Encounter for screening for human papillomavirus (HPV): Secondary | ICD-10-CM | POA: Diagnosis present

## 2016-08-26 LAB — CYTOLOGY - PAP

## 2016-12-30 DIAGNOSIS — R8290 Unspecified abnormal findings in urine: Secondary | ICD-10-CM | POA: Diagnosis not present

## 2017-01-17 DIAGNOSIS — Z0000 Encounter for general adult medical examination without abnormal findings: Secondary | ICD-10-CM | POA: Diagnosis not present

## 2017-01-17 DIAGNOSIS — G44209 Tension-type headache, unspecified, not intractable: Secondary | ICD-10-CM | POA: Diagnosis not present

## 2017-01-17 DIAGNOSIS — R739 Hyperglycemia, unspecified: Secondary | ICD-10-CM | POA: Diagnosis not present

## 2017-01-17 DIAGNOSIS — E559 Vitamin D deficiency, unspecified: Secondary | ICD-10-CM | POA: Diagnosis not present

## 2017-01-28 DIAGNOSIS — Z719 Counseling, unspecified: Secondary | ICD-10-CM | POA: Diagnosis not present

## 2017-04-17 DIAGNOSIS — M791 Myalgia: Secondary | ICD-10-CM | POA: Diagnosis not present

## 2017-04-17 DIAGNOSIS — R509 Fever, unspecified: Secondary | ICD-10-CM | POA: Diagnosis not present

## 2017-05-28 DIAGNOSIS — Z87891 Personal history of nicotine dependence: Secondary | ICD-10-CM | POA: Diagnosis not present

## 2017-05-28 DIAGNOSIS — K1321 Leukoplakia of oral mucosa, including tongue: Secondary | ICD-10-CM | POA: Diagnosis not present

## 2017-08-11 ENCOUNTER — Other Ambulatory Visit: Payer: Self-pay | Admitting: Obstetrics & Gynecology

## 2017-08-11 DIAGNOSIS — Z1231 Encounter for screening mammogram for malignant neoplasm of breast: Secondary | ICD-10-CM

## 2017-08-18 ENCOUNTER — Ambulatory Visit: Payer: 59

## 2017-09-29 DIAGNOSIS — R8290 Unspecified abnormal findings in urine: Secondary | ICD-10-CM | POA: Diagnosis not present

## 2017-09-29 DIAGNOSIS — R319 Hematuria, unspecified: Secondary | ICD-10-CM | POA: Diagnosis not present

## 2017-10-20 DIAGNOSIS — Z01419 Encounter for gynecological examination (general) (routine) without abnormal findings: Secondary | ICD-10-CM | POA: Diagnosis not present

## 2017-10-22 ENCOUNTER — Ambulatory Visit
Admission: RE | Admit: 2017-10-22 | Discharge: 2017-10-22 | Disposition: A | Discharge status: Home / Self Care | Payer: 59 | Source: Ambulatory Visit | Attending: Obstetrics & Gynecology | Admitting: Obstetrics & Gynecology

## 2017-10-22 DIAGNOSIS — Z1231 Encounter for screening mammogram for malignant neoplasm of breast: Secondary | ICD-10-CM | POA: Diagnosis not present

## 2018-01-15 ENCOUNTER — Ambulatory Visit
Admission: RE | Admit: 2018-01-15 | Discharge: 2018-01-15 | Disposition: A | Discharge status: Home / Self Care | Payer: 59 | Source: Ambulatory Visit | Attending: Internal Medicine | Admitting: Internal Medicine

## 2018-01-15 ENCOUNTER — Other Ambulatory Visit: Payer: Self-pay | Admitting: Internal Medicine

## 2018-01-15 DIAGNOSIS — R05 Cough: Secondary | ICD-10-CM

## 2018-01-15 DIAGNOSIS — R059 Cough, unspecified: Secondary | ICD-10-CM

## 2018-09-08 ENCOUNTER — Encounter: Payer: 59 | Attending: Surgery | Admitting: Registered"

## 2018-09-08 ENCOUNTER — Encounter: Payer: Self-pay | Admitting: Registered"

## 2018-09-08 DIAGNOSIS — Z8249 Family history of ischemic heart disease and other diseases of the circulatory system: Secondary | ICD-10-CM | POA: Insufficient documentation

## 2018-09-08 DIAGNOSIS — Z87891 Personal history of nicotine dependence: Secondary | ICD-10-CM | POA: Insufficient documentation

## 2018-09-08 DIAGNOSIS — Z713 Dietary counseling and surveillance: Secondary | ICD-10-CM | POA: Diagnosis present

## 2018-09-08 DIAGNOSIS — Z6841 Body Mass Index (BMI) 40.0 and over, adult: Secondary | ICD-10-CM | POA: Insufficient documentation

## 2018-09-08 DIAGNOSIS — Z880 Allergy status to penicillin: Secondary | ICD-10-CM | POA: Insufficient documentation

## 2018-09-08 DIAGNOSIS — E7800 Pure hypercholesterolemia, unspecified: Secondary | ICD-10-CM | POA: Insufficient documentation

## 2018-09-08 DIAGNOSIS — E669 Obesity, unspecified: Secondary | ICD-10-CM

## 2018-09-08 DIAGNOSIS — M1990 Unspecified osteoarthritis, unspecified site: Secondary | ICD-10-CM | POA: Diagnosis not present

## 2018-09-08 DIAGNOSIS — Z79899 Other long term (current) drug therapy: Secondary | ICD-10-CM | POA: Insufficient documentation

## 2018-09-08 NOTE — Progress Notes (Signed)
Pre-Op Assessment Visit:  Pre-Operative Sleeve Gastrectomy Surgery  Medical Nutrition Therapy:  Appt start time: 2:10  End time:  3:25  Patient was seen on 09/08/2018 for Pre-Operative Nutrition Assessment. Assessment and letter of approval faxed to [facility] Bariatric Surgery Program coordinator on 09/08/2018.   Pt expectation of surgery: help with controlling hunger to maintain health, prolong life, walk down without getting winded  Pt expectation of Dietitian: none stated  Start weight at [facility]: 252.7 BMI: 41.73   Pt states she does not eat out often. Pt states she loves broccoli and rice. Pt states she has a sweets addiction. Pt states she does not drink soda or sweet tea.   Per insurance, pt needs 6 SWL visits prior to surgery. Pt states she has already completed 6 months of visits and completed them last year. Pt states her insurance requires she has visits within the past 2 years.    24 hr Dietary Recall: First Meal: egg whites + egg + greek yogurt Snack: Naked juice or cucumbers Second Meal: grilled chicken/salmon + broccoli + rice/cauliflower rice Snack: none Third Meal: baked fish + broccoli + rice or french fries Snack: none Beverages: Naked juice, water with flavor packs, unsweetened tea  Encouraged to engage in 75 minutes of moderate physical activity including cardiovascular and weight baring weekly  Handouts given during visit include:  . Pre-Op Goals . Bariatric Surgery Protein Shakes . Vitamin and Mineral Recommendations  During the appointment today the following Pre-Op Goals were reviewed with the patient: . Track your food and beverage: MyFitness Pal or Baritastic App . Make healthy food choices . Begin to limit portion sizes . Limited concentrated sugars and fried foods . Keep fat/sugar in the single digits per serving on             food labels . Practice CHEWING your food  (aim for 30 chews per bite or until applesauce  consistency) . Practice not drinking 15 minutes before, during, and 30 minutes after each meal/snack . Avoid all carbonated beverages  . Avoid/limit caffeinated beverages  . Avoid all sugar-sweetened beverages . Avoid alcohol . Consume 3 meals per day; eat every 3-5 hours . Make a list of non-food related activities . Aim for 64-100 ounces of FLUID daily  . Aim for at least 60-80 grams of PROTEIN daily . Look for a liquid protein source that contain ?15 g protein and ?5 g carbohydrate  (ex: shakes, drinks, shots) . Physical activity is an important part of a healthy lifestyle so keep it moving!  Follow diet recommendations listed below Energy and Macronutrient Recommendations: Calories: 1800 Carbohydrate: 200 Protein: 135 Fat: 50  Demonstrated degree of understanding via:  Teach Back   Teaching Method Utilized:  Visual Auditory Hands on  Barriers to learning/adherence to lifestyle change: none identified  Patient to call the [facility] to enroll in Pre-Op and Post-Op Nutrition Education when surgery date is scheduled.

## 2018-09-08 NOTE — Patient Instructions (Signed)
-   Replace caffeine option with decaf option.

## 2018-09-25 ENCOUNTER — Other Ambulatory Visit (HOSPITAL_COMMUNITY): Payer: Self-pay | Admitting: Surgery

## 2018-10-12 ENCOUNTER — Encounter: Payer: Self-pay | Admitting: Skilled Nursing Facility1

## 2018-10-12 ENCOUNTER — Encounter: Payer: 59 | Attending: Surgery | Admitting: Skilled Nursing Facility1

## 2018-10-12 DIAGNOSIS — Z713 Dietary counseling and surveillance: Secondary | ICD-10-CM | POA: Insufficient documentation

## 2018-10-12 DIAGNOSIS — M1990 Unspecified osteoarthritis, unspecified site: Secondary | ICD-10-CM | POA: Diagnosis not present

## 2018-10-12 DIAGNOSIS — Z79899 Other long term (current) drug therapy: Secondary | ICD-10-CM | POA: Diagnosis not present

## 2018-10-12 DIAGNOSIS — Z87891 Personal history of nicotine dependence: Secondary | ICD-10-CM | POA: Diagnosis not present

## 2018-10-12 DIAGNOSIS — Z6841 Body Mass Index (BMI) 40.0 and over, adult: Secondary | ICD-10-CM | POA: Insufficient documentation

## 2018-10-12 DIAGNOSIS — Z880 Allergy status to penicillin: Secondary | ICD-10-CM | POA: Diagnosis not present

## 2018-10-12 DIAGNOSIS — Z8249 Family history of ischemic heart disease and other diseases of the circulatory system: Secondary | ICD-10-CM | POA: Diagnosis not present

## 2018-10-12 DIAGNOSIS — E7800 Pure hypercholesterolemia, unspecified: Secondary | ICD-10-CM | POA: Insufficient documentation

## 2018-10-12 DIAGNOSIS — E669 Obesity, unspecified: Secondary | ICD-10-CM

## 2018-10-12 NOTE — Progress Notes (Signed)
Pre-Operative Nutrition Class:  Appt start time: 3143   End time:  1830.  Patient was seen on 10/12/2018 for Pre-Operative Bariatric Surgery Education at [facility].   Surgery date:  Surgery type: sleeve Start weight at [facility]: 252.7 Weight today: 256.4#  Samples given per MNT protocol. Patient educated on appropriate usage: Bariatric Advantage Multivitamin Lot # [id number] Exp:10/20  Bariatric Advantage Calcium  Lot # [id number] Exp: jul-25-2020  Renee Pain Protein Powder  Shake Lot # [id number]         March-06-2019 Exp: 01/2020        [id number]  The following the learning objectives were met by the patient during this course:  Identify Pre-Op Dietary Goals and will begin 2 weeks pre-operatively  Identify appropriate sources of fluids and proteins   State protein recommendations and appropriate sources pre and post-operatively  Identify Post-Operative Dietary Goals and will follow for 2 weeks post-operatively  Identify appropriate multivitamin and calcium sources  Describe the need for physical activity post-operatively and will follow MD recommendations  State when to call healthcare provider regarding medication questions or post-operative complications  Handouts given during class include:  Pre-Op Bariatric Surgery Diet Handout  Protein Shake Handout  Post-Op Bariatric Surgery Nutrition Handout  BELT Program Information Flyer  Support Group Information Flyer  [facility] Bariatric Supplements Price List  Follow-Up Plan: [facility] diet advancement per MD.

## 2018-10-19 ENCOUNTER — Other Ambulatory Visit (HOSPITAL_COMMUNITY): Payer: Self-pay | Admitting: Surgery

## 2018-10-19 ENCOUNTER — Ambulatory Visit (HOSPITAL_COMMUNITY)
Admission: RE | Admit: 2018-10-19 | Discharge: 2018-10-19 | Disposition: A | Discharge status: Home / Self Care | Payer: 59 | Source: Ambulatory Visit | Attending: Surgery | Admitting: Surgery

## 2018-10-19 ENCOUNTER — Other Ambulatory Visit: Payer: Self-pay

## 2018-10-19 DIAGNOSIS — K449 Diaphragmatic hernia without obstruction or gangrene: Secondary | ICD-10-CM | POA: Diagnosis not present

## 2018-10-19 DIAGNOSIS — Z01818 Encounter for other preprocedural examination: Secondary | ICD-10-CM | POA: Diagnosis present

## 2018-10-21 ENCOUNTER — Other Ambulatory Visit: Payer: Self-pay | Admitting: Obstetrics & Gynecology

## 2018-10-21 DIAGNOSIS — Z1231 Encounter for screening mammogram for malignant neoplasm of breast: Secondary | ICD-10-CM

## 2018-10-22 ENCOUNTER — Ambulatory Visit: Payer: 59 | Admitting: Psychiatry

## 2018-10-23 ENCOUNTER — Ambulatory Visit
Admission: RE | Admit: 2018-10-23 | Discharge: 2018-10-23 | Disposition: A | Discharge status: Home / Self Care | Payer: 59 | Source: Ambulatory Visit

## 2018-10-23 ENCOUNTER — Ambulatory Visit: Payer: Self-pay | Admitting: Surgery

## 2018-10-23 DIAGNOSIS — Z1231 Encounter for screening mammogram for malignant neoplasm of breast: Secondary | ICD-10-CM

## 2018-10-23 NOTE — H&P (View-Only) (Signed)
Surgical HP  CC: morbid obesity  HPI: Presents for preop apointment.  Apparently she also consulted with [facility] bariatric surgery program and went through essentially the entire workup with them as well but has decided to proceed with her bariatric surgery here in [place].  She  Has completed preop pathway with no barriers identified.  nutritionist- approved psych-Dr. Saffo- approved 09/30/18 CXR/UGI-10/19/18- 4 lumbar type nonrib-bearing vertebrae with presumed sacralized L5, small type I hiatal hernia, no observed reflux.  She had a normal chest x-ray at [facility], in addition to which she had a right upper quadrant ultrasound demonstrates fatty liver, no gallstones. Labs-H. pylori negative, otherwise unremarkable.  Any of these are viewable on the October 9 office visit in Epic.  Her BMI today is up to 43.17, weight 259.4- she has actually gained almost 13 pounds in the last 2 months.  Initial consult 08/26/18: 47-year-old woman here to discuss bariatric surgery. She has been struggling with her weight for about 20 years and has been considering bariatric surgery for the last 10. She has tried numerous methods of weight loss with some success- including multiple trials of Weight Watchers, the bariatric clinic here in [place], and livefit. She also fast annually with her church and loses weight with that. She's been able to lose 30 or so pounds but ultimately regains it with more.  The main reason that she wants to pursue weight loss that she is very developed worsening lower lumbar arthritis which causes her to have significant back and thigh pain. This affects her sleep and has really limited her ability to exercise. She has noticed that when she is lighter the pain does not bother her as much.  She denies reflux but notes occasional chest pain that awakens her from sleep. She states that her doctor has called this angina but that she has never had any cardiac workup. No exertional  symptoms.  Medical history unremarkable  Surgical history notable for open appendectomy and C-section  Family history unremarkable  Denies alcohol or drug use but is a former smoker. 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 She has 3 sons (24, 22, 15).  Allergies  Allergen Reactions  . Kiwi Extract Itching  . Nickel Nausea And Vomiting  . Penicillins     Childhood rxn    Past Medical History:  Diagnosis Date  . Blighted ovum 03/31/2013   GS on u/s at [facility] 03/29/13 w/ no ys and no fetal pole, measuring [redacted]w[redacted]d (vs [redacted]w[redacted]d LMP); seen by EP, P.A. 03/29/13 for VB  . BV (bacterial vaginosis)   . Spontaneous miscarriage 03/31/2013  . UTI (lower urinary tract infection)     Past Surgical History:  Procedure Laterality Date  . APPENDECTOMY    . CESAREAN SECTION    . DILITATION & CURRETTAGE/HYSTROSCOPY WITH NOVASURE ABLATION N/A 11/04/2014   Procedure: DILATATION & CURETTAGE/HYSTEROSCOPY WITH NOVASURE ABLATION;  Surgeon: Jennifer M Ozan, DO;  Location: [facility];  Service: Gynecology;  Laterality: N/A;    Family History  Problem Relation Age of Onset  . Hypertension Other   . Breast cancer Neg Hx     Social History   Socioeconomic History  . Marital status: Single    Spouse name: Not on file  . Number of children: Not on file  . Years of education: Not on file  . Highest education level: Not on file  Occupational History  . Not on file  Social Needs  .   Financial resource strain: Not on file  . Food insecurity:    Worry: Not on file    Inability: Not on file  . Transportation needs:    Medical: Not on file    Non-medical: Not on file  Tobacco Use  . Smoking status: Current Every Day Smoker  Substance and Sexual Activity  . Alcohol use: No  . Drug use: No  . Sexual activity: Yes    Birth control/protection: None  Lifestyle  . Physical activity:    Days per week: Not on file    Minutes per session: Not on file  . Stress: Not  on file  Relationships  . Social connections:    Talks on phone: Not on file    Gets together: Not on file    Attends religious service: Not on file    Active member of club or organization: Not on file    Attends meetings of clubs or organizations: Not on file    Relationship status: Not on file  Other Topics Concern  . Not on file  Social History Narrative  . Not on file    Current Outpatient Medications on File Prior to Visit  Medication Sig Dispense Refill  . ibuprofen (ADVIL,MOTRIN) 600 MG tablet Take 1 tablet (600 mg total) by mouth every 6 (six) hours as needed for pain. (Patient not taking: Reported on 10/20/2014) 36 tablet 2  . norgestimate-ethinyl estradiol (ORTHO-CYCLEN,SPRINTEC,PREVIFEM) 0.25-35 MG-MCG tablet Take 1 tablet by mouth daily.    . oxyCODONE-acetaminophen (PERCOCET/ROXICET) 5-325 MG per tablet Take 1 tablet by mouth every 4 (four) hours as needed for pain. (Patient not taking: Reported on 09/08/2018) 36 tablet 0  . triamcinolone cream (KENALOG) 0.1 % Apply 1 application topically 2 (two) times daily. For eczema  0   No current facility-administered medications on file prior to visit.     Review of Systems: a complete, 10pt review of systems was completed with pertinent positives and negatives as documented in the HPI  Physical Exam: There were no vitals filed for this visit. Gen: A&Ox3, no distress  Head: normocephalic, atraumatic Eyes: extraocular motions intact, anicteric.  Neck: supple without mass or thyromegaly Chest: unlabored respirations, symmetrical air entry, clear bilaterally   Cardiovascular: RRR with palpable distal pulses, no pedal edema Abdomen: soft, nondistended, nontender. No mass or organomegaly.  Extremities: warm, without edema, no deformities  Neuro: grossly intact Psych: appropriate mood and affect, normal insight  Skin: warm and dry   CBC Latest Ref Rng & Units 11/04/2014 03/31/2013 11/30/2010  WBC 4.0 - 10.5 K/uL 11.7(H) 16.5(H)  14.0(H)  Hemoglobin 12.0 - 15.0 g/dL 13.9 11.9(L) 14.7  Hematocrit 36.0 - 46.0 % 41.0 34.8(L) 43.4  Platelets 150 - 400 K/uL 319 251 319    CMP Latest Ref Rng & Units 11/04/2014 09/24/2010 11/02/2009  Glucose 70 - 99 mg/dL 88 88 66(L)  BUN 6 - 23 mg/dL 8 10 9  Creatinine 0.50 - 1.10 mg/dL 0.69 0.58 0.72  Sodium 137 - 147 mEq/L 139 142 140  Potassium 3.7 - 5.3 mEq/L 4.1 3.8 4.4  Chloride 96 - 112 mEq/L 100 106 103  CO2 19 - 32 mEq/L 29 24 24  Calcium 8.4 - 10.5 mg/dL 9.6 9.1 9.5  Total Protein 6.0 - 8.3 g/dL - 7.1 7.7  Total Bilirubin 0.3 - 1.2 mg/dL - 0.3 0.4  Alkaline Phos 39 - 117 units/L - 35(L) 34(L)  AST 0 - 37 units/L - 14 19  ALT 0 - 35 units/L -   15 15    No results found for: INR, PROTIME  Imaging: No results found.   A/P: MORBID OBESITY (E66.01) Story: She has completed the preoperative workup and is by all consultants and myself deemed an excellent candidate for bariatric surgery- sleeve gastrectomy. We have previously discussed the surgery including technical aspects, the risks of bleeding, infection, pain, scarring, injury to intra-abdominal structures, staple line leak or abscess, chronic abdominal pain or nausea, new onset or worsened GERD, DVT/PE, pneumonia, heart attack, stroke, death, failure to reach weight loss goals and weight regain, hernia. Discussed the typical pre-, peri-, and postoperative course. Discussed the importance of lifelong behavioral changes to combat the chronic and relapsing disease which is obesity. Questions were welcomed and answered to her satisfaction. She is eager to proceed pending insurance authorization.   Drayson Dorko, MD [facility] Pager [phone]  

## 2018-10-23 NOTE — H&P (Signed)
Surgical HP  CC: morbid obesity  HPI: Presents for preop apointment.  Apparently she also consulted with [facility] bariatric surgery program and went through essentially the entire workup with them as well but has decided to proceed with her bariatric surgery here in [place].  She  Has completed preop pathway with no barriers identified.  nutritionist- approved psych-Dr. Verdell Face- approved 09/30/18 CXR/UGI-10/19/18- 4 lumbar type nonrib-bearing vertebrae with presumed sacralized L5, small type I hiatal hernia, no observed reflux.  She had a normal chest x-ray at [facility], in addition to which she had a right upper quadrant ultrasound demonstrates fatty liver, no gallstones. Labs-H. pylori negative, otherwise unremarkable.  Any of these are viewable on the October 9 office visit in Epic.  Her BMI today is up to 43.17, weight 259.4- she has actually gained almost 13 pounds in the last 2 months.  Initial consult 08/26/18: 47 year old woman here to discuss bariatric surgery. She has been struggling with her weight for about 20 years and has been considering bariatric surgery for the last 10. She has tried numerous methods of weight loss with some success- including multiple trials of Weight Watchers, the bariatric clinic here in [place], and livefit. She also fast annually with her church and loses weight with that. She's been able to lose 30 or so pounds but ultimately regains it with more.  The main reason that she wants to pursue weight loss that she is very developed worsening lower lumbar arthritis which causes her to have significant back and thigh pain. This affects her sleep and has really limited her ability to exercise. She has noticed that when she is lighter the pain does not bother her as much.  She denies reflux but notes occasional chest pain that awakens her from sleep. She states that her doctor has called this angina but that she has never had any cardiac workup. No exertional  symptoms.  Medical history unremarkable  Surgical history notable for open appendectomy and C-section  Family history unremarkable  Denies alcohol or drug use but is a former smoker. 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 She has 3 sons (24, 64, 15).  Allergies  Allergen Reactions  . Kiwi Extract Itching  . Nickel Nausea And Vomiting  . Penicillins     Childhood rxn    Past Medical History:  Diagnosis Date  . Blighted ovum 03/31/2013   GS on u/s at [facility] 03/29/13 w/ no ys and no fetal pole, measuring [redacted]w[redacted]d (vs [redacted]w[redacted]d LMP); seen by EP, P.A. 03/29/13 for VB  . BV (bacterial vaginosis)   . Spontaneous miscarriage 03/31/2013  . UTI (lower urinary tract infection)     Past Surgical History:  Procedure Laterality Date  . APPENDECTOMY    . CESAREAN SECTION    . DILITATION & CURRETTAGE/HYSTROSCOPY WITH NOVASURE ABLATION N/A 11/04/2014   Procedure: DILATATION & CURETTAGE/HYSTEROSCOPY WITH NOVASURE ABLATION;  Surgeon: Sharon Seller, DO;  Location: [facility];  Service: Gynecology;  Laterality: N/A;    Family History  Problem Relation Age of Onset  . Hypertension Other   . Breast cancer Neg Hx     Social History   Socioeconomic History  . Marital status: Single    Spouse name: Not on file  . Number of children: Not on file  . Years of education: Not on file  . Highest education level: Not on file  Occupational History  . Not on file  Social Needs  .  Financial resource strain: Not on file  . Food insecurity:    Worry: Not on file    Inability: Not on file  . Transportation needs:    Medical: Not on file    Non-medical: Not on file  Tobacco Use  . Smoking status: Current Every Day Smoker  Substance and Sexual Activity  . Alcohol use: No  . Drug use: No  . Sexual activity: Yes    Birth control/protection: None  Lifestyle  . Physical activity:    Days per week: Not on file    Minutes per session: Not on file  . Stress: Not  on file  Relationships  . Social connections:    Talks on phone: Not on file    Gets together: Not on file    Attends religious service: Not on file    Active member of club or organization: Not on file    Attends meetings of clubs or organizations: Not on file    Relationship status: Not on file  Other Topics Concern  . Not on file  Social History Narrative  . Not on file    Current Outpatient Medications on File Prior to Visit  Medication Sig Dispense Refill  . ibuprofen (ADVIL,MOTRIN) 600 MG tablet Take 1 tablet (600 mg total) by mouth every 6 (six) hours as needed for pain. (Patient not taking: Reported on 10/20/2014) 36 tablet 2  . norgestimate-ethinyl estradiol (ORTHO-CYCLEN,SPRINTEC,PREVIFEM) 0.25-35 MG-MCG tablet Take 1 tablet by mouth daily.    Marland Kitchen oxyCODONE-acetaminophen (PERCOCET/ROXICET) 5-325 MG per tablet Take 1 tablet by mouth every 4 (four) hours as needed for pain. (Patient not taking: Reported on 09/08/2018) 36 tablet 0  . triamcinolone cream (KENALOG) 0.1 % Apply 1 application topically 2 (two) times daily. For eczema  0   No current facility-administered medications on file prior to visit.     Review of Systems: a complete, 10pt review of systems was completed with pertinent positives and negatives as documented in the HPI  Physical Exam: There were no vitals filed for this visit. Gen: A&Ox3, no distress  Head: normocephalic, atraumatic Eyes: extraocular motions intact, anicteric.  Neck: supple without mass or thyromegaly Chest: unlabored respirations, symmetrical air entry, clear bilaterally   Cardiovascular: RRR with palpable distal pulses, no pedal edema Abdomen: soft, nondistended, nontender. No mass or organomegaly.  Extremities: warm, without edema, no deformities  Neuro: grossly intact Psych: appropriate mood and affect, normal insight  Skin: warm and dry   CBC Latest Ref Rng & Units 11/04/2014 03/31/2013 11/30/2010  WBC 4.0 - 10.5 K/uL 11.7(H) 16.5(H)  14.0(H)  Hemoglobin 12.0 - 15.0 g/dL 16.1 11.9(L) 14.7  Hematocrit 36.0 - 46.0 % 41.0 34.8(L) 43.4  Platelets 150 - 400 K/uL 319 251 319    CMP Latest Ref Rng & Units 11/04/2014 09/24/2010 11/02/2009  Glucose 70 - 99 mg/dL 88 88 09(U)  BUN 6 - 23 mg/dL 8 10 9   Creatinine 0.50 - 1.10 mg/dL 0.45 4.09 8.11  Sodium 137 - 147 mEq/L 139 142 140  Potassium 3.7 - 5.3 mEq/L 4.1 3.8 4.4  Chloride 96 - 112 mEq/L 100 106 103  CO2 19 - 32 mEq/L 29 24 24   Calcium 8.4 - 10.5 mg/dL 9.6 9.1 9.5  Total Protein 6.0 - 8.3 g/dL - 7.1 7.7  Total Bilirubin 0.3 - 1.2 mg/dL - 0.3 0.4  Alkaline Phos 39 - 117 units/L - 35(L) 34(L)  AST 0 - 37 units/L - 14 19  ALT 0 - 35 units/L -  15 15    No results found for: INR, PROTIME  Imaging: No results found.   A/P: MORBID OBESITY (E66.01) Story: She has completed the preoperative workup and is by all consultants and myself deemed an excellent candidate for bariatric surgery- sleeve gastrectomy. We have previously discussed the surgery including technical aspects, the risks of bleeding, infection, pain, scarring, injury to intra-abdominal structures, staple line leak or abscess, chronic abdominal pain or nausea, new onset or worsened GERD, DVT/PE, pneumonia, heart attack, stroke, death, failure to reach weight loss goals and weight regain, hernia. Discussed the typical pre-, peri-, and postoperative course. Discussed the importance of lifelong behavioral changes to combat the chronic and relapsing disease which is obesity. Questions were welcomed and answered to her satisfaction. She is eager to proceed pending insurance authorization.   Phylliss Blakeshelsea Yaneli Keithley, MD [facility] [place] Pager [phone]

## 2018-10-28 ENCOUNTER — Ambulatory Visit (HOSPITAL_COMMUNITY): Payer: 59

## 2018-11-12 NOTE — Patient Instructions (Addendum)
Monica Walton  11/12/2018   Your procedure is scheduled on: 11/17/2018   Report to [facility] Main  Entrance  Report to admitting at   0515 AM    Call this number if you have problems the morning of surgery [phone]                                    Monica Walton  11/12/2018   Your procedure is scheduled on:   Report to [facility] Main  Entrance  Report to admitting at AM    Call this number if you have problems the morning of surgery [phone]   Remember: Do not eat food or drink liquids :After Midnight. BRUSH YOUR TEETH MORNING OF SURGERY AND RINSE YOUR MOUTH OUT, NO CHEWING GUM CANDY OR MINTS.  NO SOLID FOOD AFTER MIDNIGHT THE NIGHT PRIOR TO SURGERY. NOTHING BY MOUTH EXCEPT CLEAR LIQUIDS UNTIL 3 HOURS PRIOR TO SCHEULED SURGERY. PLEASE FINISH ENSURE DRINK PER SURGEON ORDER 3 HOURS PRIOR TO SCHEDULED SURGERY TIME WHICH NEEDS TO BE COMPLETED AT ____________.    CLEAR LIQUID DIET   Foods Allowed                                                                     Foods Excluded  Coffee and tea, regular and decaf                             liquids that you cannot  Plain Jell-O in any flavor                                             see through such as: Fruit ices (not with fruit pulp)                                     milk, soups, orange juice  Iced Popsicles                                    All solid food Carbonated beverages, regular and diet                                    Cranberry, grape and apple juices Sports drinks like Gatorade Lightly seasoned clear broth or consume(fat free) Sugar, honey syrup  Sample Menu Breakfast                                Lunch  Supper Cranberry juice                    Beef broth                            Chicken broth Jell-O                                     Grape juice                           Apple juice Coffee  or tea                        Jell-O                                      Popsicle                                                Coffee or tea                        Coffee or tea  _____________________________________________________________________    Take these medicines the morning of surgery with A SIP OF WATER: none                                 You may not have any metal on your body including hair pins and              piercings  Do not wear jewelry, make-up, lotions, powders or perfumes, deodorant             Do not wear nail polish.  Do not shave  48 hours prior to surgery.          Do not bring valuables to the hospital. [facility] IS NOT             RESPONSIBLE   FOR VALUABLES.  Contacts, dentures or bridgework may not be worn into surgery.  Leave suitcase in the car. After surgery it may be brought to your room.     Patients discharged the day of surgery will not be allowed to drive home.  Name and phone number of your driver:  Special Instructions:coughing and deep breathing exercises, leg exercises               Please read over the following fact sheets you were given: _____________________________________________________________________  [facility] - Preparing for Surgery Before surgery, you can play an important role.  Because skin is not sterile, your skin needs to be as free of germs as possible.  You can reduce the number of germs on your skin by washing with CHG (chlorahexidine gluconate) soap before surgery.  CHG is an antiseptic cleaner which kills germs and bonds with the skin to continue killing germs even after washing. Please DO NOT use if you have an allergy to CHG or antibacterial soaps.  If your skin becomes reddened/irritated stop using the CHG and inform your nurse  when you arrive at Short Stay. Do not shave (including legs and underarms) for at least 48 hours prior to the first CHG shower.  You may shave your face/neck. Please follow these  instructions carefully:  1.  Shower with CHG Soap the night before surgery and the  morning of Surgery.  2.  If you choose to wash your hair, wash your hair first as usual with your  normal  shampoo.  3.  After you shampoo, rinse your hair and body thoroughly to remove the  shampoo.                           4.  Use CHG as you would any other liquid soap.  You can apply chg directly  to the skin and wash                       Gently with a scrungie or clean washcloth.  5.  Apply the CHG Soap to your body ONLY FROM THE NECK DOWN.   Do not use on face/ open                           Wound or open sores. Avoid contact with eyes, ears mouth and genitals (private parts).                       Wash face,  Genitals (private parts) with your normal soap.             6.  Wash thoroughly, paying special attention to the area where your surgery  will be performed.  7.  Thoroughly rinse your body with warm water from the neck down.  8.  DO NOT shower/wash with your normal soap after using and rinsing off  the CHG Soap.                9.  Pat yourself dry with a clean towel.            10.  Wear clean pajamas.            11.  Place clean sheets on your bed the night of your first shower and do not  sleep with pets. Day of Surgery : Do not apply any lotions/deodorants the morning of surgery.  Please wear clean clothes to the hospital/surgery center.  FAILURE TO FOLLOW THESE INSTRUCTIONS MAY RESULT IN THE CANCELLATION OF YOUR SURGERY PATIENT SIGNATURE_________________________________  NURSE SIGNATURE__________________________________  ________________________________________________________________________

## 2018-11-13 ENCOUNTER — Other Ambulatory Visit: Payer: Self-pay

## 2018-11-13 ENCOUNTER — Encounter (HOSPITAL_COMMUNITY): Payer: Self-pay

## 2018-11-13 ENCOUNTER — Encounter (HOSPITAL_COMMUNITY)
Admission: RE | Admit: 2018-11-13 | Discharge: 2018-11-13 | Disposition: A | Discharge status: Home / Self Care | Payer: 59 | Source: Ambulatory Visit | Attending: Surgery | Admitting: Surgery

## 2018-11-13 DIAGNOSIS — Z01812 Encounter for preprocedural laboratory examination: Secondary | ICD-10-CM | POA: Insufficient documentation

## 2018-11-13 HISTORY — DX: Gastro-esophageal reflux disease without esophagitis: K21.9

## 2018-11-13 HISTORY — DX: Unspecified osteoarthritis, unspecified site: M19.90

## 2018-11-13 HISTORY — DX: Other asthma: J45.998

## 2018-11-13 LAB — COMPREHENSIVE METABOLIC PANEL
ALT: 26 U/L (ref 0–44)
AST: 24 U/L (ref 15–41)
Albumin: 4 g/dL (ref 3.5–5.0)
Alkaline Phosphatase: 27 U/L — ABNORMAL LOW (ref 38–126)
Anion gap: 9 (ref 5–15)
BUN: 12 mg/dL (ref 6–20)
CO2: 25 mmol/L (ref 22–32)
Calcium: 9.2 mg/dL (ref 8.9–10.3)
Chloride: 105 mmol/L (ref 98–111)
Creatinine, Ser: 0.61 mg/dL (ref 0.44–1.00)
GFR calc Af Amer: >60 mL/min (ref >60)
GFR calc non Af Amer: >60 mL/min (ref >60)
Glucose, Bld: 106 mg/dL — ABNORMAL HIGH (ref 70–99)
Potassium: 4.3 mmol/L (ref 3.5–5.1)
Sodium: 139 mmol/L (ref 135–145)
Total Bilirubin: 0.4 mg/dL (ref 0.3–1.2)
Total Protein: 7.1 g/dL (ref 6.5–8.1)

## 2018-11-13 LAB — CBC WITH DIFFERENTIAL/PLATELET
Abs Immature Granulocytes: 0.01 10*3/uL (ref 0.00–0.07)
Basophils Absolute: 0 10*3/uL (ref 0.0–0.1)
Basophils Relative: 0 %
EOS PCT: 2 %
Eosinophils Absolute: 0.2 10*3/uL (ref 0.0–0.5)
HCT: 45.3 % (ref 36.0–46.0)
Hemoglobin: 14.7 g/dL (ref 12.0–15.0)
Immature Granulocytes: 0 %
LYMPHS ABS: 2.8 10*3/uL (ref 0.7–4.0)
Lymphocytes Relative: 35 %
MCH: 29.6 pg (ref 26.0–34.0)
MCHC: 32.5 g/dL (ref 30.0–36.0)
MCV: 91.3 fL (ref 80.0–100.0)
Monocytes Absolute: 0.7 10*3/uL (ref 0.1–1.0)
Monocytes Relative: 8 %
NRBC: 0 % (ref 0.0–0.2)
Neutro Abs: 4.5 10*3/uL (ref 1.7–7.7)
Neutrophils Relative %: 55 %
Platelets: 393 10*3/uL (ref 150–400)
RBC: 4.96 MIL/uL (ref 3.87–5.11)
RDW: 12.1 % (ref 11.5–15.5)
WBC: 8.2 10*3/uL (ref 4.0–10.5)

## 2018-11-13 LAB — ABO/RH: ABO/RH(D): A POS

## 2018-11-13 MED ORDER — CHLORHEXIDINE GLUCONATE 4 % EX LIQD
60.0000 mL | Freq: Once | CUTANEOUS | Status: DC
  Filled 2018-11-13: qty 60

## 2018-11-13 NOTE — Progress Notes (Signed)
EKG-10/19/18-epic  CXR-01/15/18-epic

## 2018-11-16 MED ORDER — GENTAMICIN SULFATE 40 MG/ML IJ SOLN
1.5000 mg/kg | INTRAVENOUS | Status: AC
  Administered 2018-11-17: 170 mg via INTRAVENOUS
  Filled 2018-11-16: qty 4.25

## 2018-11-16 MED ORDER — BUPIVACAINE LIPOSOME 1.3 % IJ SUSP
20.0000 mL | Freq: Once | INTRAMUSCULAR | Status: DC
  Filled 2018-11-16: qty 20

## 2018-11-16 NOTE — Anesthesia Preprocedure Evaluation (Addendum)
Anesthesia Evaluation  Patient identified by MRN, date of birth, ID band Patient awake    Reviewed: Allergy & Precautions, NPO status , Patient's Chart, lab work & pertinent test results  History of Anesthesia Complications Negative for: history of anesthetic complications  Airway Mallampati: III  TM Distance: >3 FB Neck ROM: Full    Dental no notable dental hx. (+) Dental Advisory Given   Pulmonary neg pulmonary ROS, former smoker,    Pulmonary exam normal        Cardiovascular negative cardio ROS Normal cardiovascular exam     Neuro/Psych negative neurological ROS  negative psych ROS   GI/Hepatic Neg liver ROS, GERD  ,  Endo/Other  Morbid obesity  Renal/GU negative Renal ROS     Musculoskeletal  (+) Arthritis ,   Abdominal   Peds  Hematology negative hematology ROS (+)   Anesthesia Other Findings Day of surgery medications reviewed with the patient.  Reproductive/Obstetrics                            Anesthesia Physical Anesthesia Plan  ASA: III  Anesthesia Plan: General   Post-op Pain Management:    Induction: Intravenous  PONV Risk Score and Plan: 4 or greater and Dexamethasone, Ondansetron, Scopolamine patch - Pre-op and Diphenhydramine  Airway Management Planned: Oral ETT  Additional Equipment:   Intra-op Plan:   Post-operative Plan: Extubation in OR  Informed Consent: I have reviewed the patients History and Physical, chart, labs and discussed the procedure including the risks, benefits and alternatives for the proposed anesthesia with the patient or authorized representative who has indicated his/her understanding and acceptance.   Dental advisory given  Plan Discussed with: Anesthesiologist and CRNA  Anesthesia Plan Comments:        Anesthesia Quick Evaluation

## 2018-11-17 ENCOUNTER — Other Ambulatory Visit: Payer: Self-pay

## 2018-11-17 ENCOUNTER — Encounter (HOSPITAL_COMMUNITY): Payer: Self-pay | Admitting: *Deleted

## 2018-11-17 ENCOUNTER — Inpatient Hospital Stay (HOSPITAL_COMMUNITY): Payer: 59 | Admitting: Certified Registered"

## 2018-11-17 ENCOUNTER — Inpatient Hospital Stay (HOSPITAL_COMMUNITY)
Admission: RE | Admit: 2018-11-17 | Discharge: 2018-11-18 | DRG: 621 | Disposition: A | Discharge status: Home / Self Care | Payer: 59 | Attending: Surgery | Admitting: Surgery

## 2018-11-17 ENCOUNTER — Encounter (HOSPITAL_COMMUNITY)
Admission: RE | Disposition: A | Discharge status: Home / Self Care | Payer: Self-pay | Source: Home / Self Care | Attending: Surgery

## 2018-11-17 DIAGNOSIS — Z8249 Family history of ischemic heart disease and other diseases of the circulatory system: Secondary | ICD-10-CM | POA: Diagnosis not present

## 2018-11-17 DIAGNOSIS — K760 Fatty (change of) liver, not elsewhere classified: Secondary | ICD-10-CM | POA: Diagnosis present

## 2018-11-17 DIAGNOSIS — Z6841 Body Mass Index (BMI) 40.0 and over, adult: Secondary | ICD-10-CM

## 2018-11-17 DIAGNOSIS — Z87891 Personal history of nicotine dependence: Secondary | ICD-10-CM | POA: Diagnosis not present

## 2018-11-17 HISTORY — PX: LAPAROSCOPIC GASTRIC SLEEVE RESECTION: SHX5895

## 2018-11-17 LAB — TYPE AND SCREEN
ABO/RH(D): A POS
Antibody Screen: NEGATIVE

## 2018-11-17 LAB — HCG, SERUM, QUALITATIVE: Preg, Serum: NEGATIVE

## 2018-11-17 SURGERY — GASTRECTOMY, SLEEVE, LAPAROSCOPIC
Anesthesia: General

## 2018-11-17 MED ORDER — ROCURONIUM BROMIDE 10 MG/ML (PF) SYRINGE
PREFILLED_SYRINGE | INTRAVENOUS | Status: DC | PRN
  Administered 2018-11-17: 100 mg via INTRAVENOUS

## 2018-11-17 MED ORDER — SUGAMMADEX SODIUM 500 MG/5ML IV SOLN
INTRAVENOUS | Status: AC
  Filled 2018-11-17: qty 5

## 2018-11-17 MED ORDER — PROMETHAZINE HCL 25 MG/ML IJ SOLN: 6.2500 mg | INTRAMUSCULAR | Status: DC | PRN

## 2018-11-17 MED ORDER — GABAPENTIN 300 MG PO CAPS
300.0000 mg | ORAL_CAPSULE | ORAL | Status: AC
  Administered 2018-11-17: 300 mg via ORAL
  Filled 2018-11-17: qty 1

## 2018-11-17 MED ORDER — DEXAMETHASONE SODIUM PHOSPHATE 10 MG/ML IJ SOLN
INTRAMUSCULAR | Status: DC | PRN
  Administered 2018-11-17: 8 mg via INTRAVENOUS

## 2018-11-17 MED ORDER — HYDRALAZINE HCL 20 MG/ML IJ SOLN: 10.0000 mg | INTRAMUSCULAR | Status: DC | PRN

## 2018-11-17 MED ORDER — APREPITANT 40 MG PO CAPS
40.0000 mg | ORAL_CAPSULE | ORAL | Status: AC
  Administered 2018-11-17: 40 mg via ORAL
  Filled 2018-11-17: qty 1

## 2018-11-17 MED ORDER — ACETAMINOPHEN 160 MG/5ML PO SOLN
650.0000 mg | Freq: Four times a day (QID) | ORAL | Status: DC
  Administered 2018-11-17 – 2018-11-18 (×3): 650 mg via ORAL
  Filled 2018-11-17 (×3): qty 20.3

## 2018-11-17 MED ORDER — DIPHENHYDRAMINE HCL 50 MG/ML IJ SOLN
INTRAMUSCULAR | Status: AC
  Filled 2018-11-17: qty 1

## 2018-11-17 MED ORDER — PHENYLEPHRINE 40 MCG/ML (10ML) SYRINGE FOR IV PUSH (FOR BLOOD PRESSURE SUPPORT)
PREFILLED_SYRINGE | INTRAVENOUS | Status: DC | PRN
  Administered 2018-11-17: 80 ug via INTRAVENOUS
  Administered 2018-11-17 (×2): 120 ug via INTRAVENOUS
  Administered 2018-11-17: 80 ug via INTRAVENOUS

## 2018-11-17 MED ORDER — ROCURONIUM BROMIDE 10 MG/ML (PF) SYRINGE
PREFILLED_SYRINGE | INTRAVENOUS | Status: AC
  Filled 2018-11-17: qty 20

## 2018-11-17 MED ORDER — PHENYLEPHRINE HCL 10 MG/ML IJ SOLN
INTRAMUSCULAR | Status: AC
  Filled 2018-11-17: qty 1

## 2018-11-17 MED ORDER — ENOXAPARIN SODIUM 40 MG/0.4ML ~~LOC~~ SOLN
40.0000 mg | SUBCUTANEOUS | Status: AC
  Administered 2018-11-17: 40 mg via SUBCUTANEOUS
  Filled 2018-11-17: qty 0.4

## 2018-11-17 MED ORDER — SCOPOLAMINE 1 MG/3DAYS TD PT72: 1.0000 | MEDICATED_PATCH | TRANSDERMAL | Status: DC

## 2018-11-17 MED ORDER — ONDANSETRON HCL 4 MG/2ML IJ SOLN
INTRAMUSCULAR | Status: AC
  Filled 2018-11-17: qty 2

## 2018-11-17 MED ORDER — DOCUSATE SODIUM 100 MG PO CAPS
100.0000 mg | ORAL_CAPSULE | Freq: Two times a day (BID) | ORAL | Status: DC
  Administered 2018-11-17 – 2018-11-18 (×2): 100 mg via ORAL
  Filled 2018-11-17 (×2): qty 1

## 2018-11-17 MED ORDER — OXYCODONE HCL 5 MG/5ML PO SOLN
5.0000 mg | ORAL | Status: DC | PRN
  Administered 2018-11-17 – 2018-11-18 (×3): 10 mg via ORAL
  Filled 2018-11-17 (×3): qty 10

## 2018-11-17 MED ORDER — DEXAMETHASONE SODIUM PHOSPHATE 10 MG/ML IJ SOLN
INTRAMUSCULAR | Status: AC
  Filled 2018-11-17: qty 2

## 2018-11-17 MED ORDER — LACTATED RINGERS IR SOLN
Status: DC | PRN
  Administered 2018-11-17: 1000 mL

## 2018-11-17 MED ORDER — FENTANYL CITRATE (PF) 250 MCG/5ML IJ SOLN
INTRAMUSCULAR | Status: AC
  Filled 2018-11-17: qty 5

## 2018-11-17 MED ORDER — LIDOCAINE 2% (20 MG/ML) 5 ML SYRINGE
INTRAMUSCULAR | Status: AC
  Filled 2018-11-17: qty 10

## 2018-11-17 MED ORDER — 0.9 % SODIUM CHLORIDE (POUR BTL) OPTIME
TOPICAL | Status: DC | PRN
  Administered 2018-11-17: 2000 mL

## 2018-11-17 MED ORDER — FENTANYL CITRATE (PF) 100 MCG/2ML IJ SOLN
25.0000 ug | INTRAMUSCULAR | Status: DC | PRN
  Administered 2018-11-17 (×2): 25 ug via INTRAVENOUS

## 2018-11-17 MED ORDER — MIDAZOLAM HCL 2 MG/2ML IJ SOLN
INTRAMUSCULAR | Status: DC | PRN
  Administered 2018-11-17 (×2): 1 mg via INTRAVENOUS

## 2018-11-17 MED ORDER — METOPROLOL TARTRATE 5 MG/5ML IV SOLN: 5.0000 mg | Freq: Four times a day (QID) | INTRAVENOUS | Status: DC | PRN

## 2018-11-17 MED ORDER — ONDANSETRON HCL 4 MG/2ML IJ SOLN: 4.0000 mg | INTRAMUSCULAR | Status: DC | PRN

## 2018-11-17 MED ORDER — SUCCINYLCHOLINE CHLORIDE 200 MG/10ML IV SOSY
PREFILLED_SYRINGE | INTRAVENOUS | Status: DC | PRN
  Administered 2018-11-17: 130 mg via INTRAVENOUS

## 2018-11-17 MED ORDER — SODIUM CHLORIDE 0.9 % IV SOLN
INTRAVENOUS | Status: DC
  Administered 2018-11-17 – 2018-11-18 (×3): via INTRAVENOUS

## 2018-11-17 MED ORDER — LACTATED RINGERS IV SOLN
INTRAVENOUS | Status: DC
  Administered 2018-11-17 (×2): via INTRAVENOUS

## 2018-11-17 MED ORDER — ONDANSETRON HCL 4 MG/2ML IJ SOLN
INTRAMUSCULAR | Status: DC | PRN
  Administered 2018-11-17: 4 mg via INTRAVENOUS

## 2018-11-17 MED ORDER — FENTANYL CITRATE (PF) 100 MCG/2ML IJ SOLN
INTRAMUSCULAR | Status: AC
  Administered 2018-11-17: 25 ug via INTRAVENOUS
  Filled 2018-11-17: qty 2

## 2018-11-17 MED ORDER — PREMIER PROTEIN SHAKE
2.0000 [oz_av] | ORAL | Status: DC
  Administered 2018-11-18 (×4): 2 [oz_av] via ORAL

## 2018-11-17 MED ORDER — METHOCARBAMOL 1000 MG/10ML IJ SOLN
500.0000 mg | Freq: Four times a day (QID) | INTRAVENOUS | Status: DC | PRN
  Filled 2018-11-17: qty 5

## 2018-11-17 MED ORDER — PANTOPRAZOLE SODIUM 40 MG IV SOLR
40.0000 mg | Freq: Every day | INTRAVENOUS | Status: DC
  Administered 2018-11-17: 40 mg via INTRAVENOUS
  Filled 2018-11-17: qty 40

## 2018-11-17 MED ORDER — DEXAMETHASONE SODIUM PHOSPHATE 4 MG/ML IJ SOLN: 4.0000 mg | INTRAMUSCULAR | Status: DC

## 2018-11-17 MED ORDER — BUPIVACAINE-EPINEPHRINE (PF) 0.25% -1:200000 IJ SOLN
INTRAMUSCULAR | Status: AC
  Filled 2018-11-17: qty 30

## 2018-11-17 MED ORDER — METOCLOPRAMIDE HCL 5 MG/ML IJ SOLN: 10.0000 mg | Freq: Four times a day (QID) | INTRAMUSCULAR | Status: DC | PRN

## 2018-11-17 MED ORDER — DEXAMETHASONE SODIUM PHOSPHATE 10 MG/ML IJ SOLN
INTRAMUSCULAR | Status: AC
  Filled 2018-11-17: qty 1

## 2018-11-17 MED ORDER — GABAPENTIN 100 MG PO CAPS
200.0000 mg | ORAL_CAPSULE | Freq: Two times a day (BID) | ORAL | Status: DC
  Administered 2018-11-17 – 2018-11-18 (×2): 200 mg via ORAL
  Filled 2018-11-17 (×2): qty 2

## 2018-11-17 MED ORDER — LIDOCAINE 2% (20 MG/ML) 5 ML SYRINGE
INTRAMUSCULAR | Status: DC | PRN
  Administered 2018-11-17: 1.5 mg/kg/h via INTRAVENOUS

## 2018-11-17 MED ORDER — BUPIVACAINE LIPOSOME 1.3 % IJ SUSP
INTRAMUSCULAR | Status: DC | PRN
  Administered 2018-11-17: 20 mL

## 2018-11-17 MED ORDER — ONDANSETRON HCL 4 MG/2ML IJ SOLN
INTRAMUSCULAR | Status: AC
  Filled 2018-11-17: qty 4

## 2018-11-17 MED ORDER — PROPOFOL 10 MG/ML IV BOLUS
INTRAVENOUS | Status: AC
  Filled 2018-11-17: qty 20

## 2018-11-17 MED ORDER — LIDOCAINE 2% (20 MG/ML) 5 ML SYRINGE
INTRAMUSCULAR | Status: DC | PRN
  Administered 2018-11-17: 60 mg via INTRAVENOUS

## 2018-11-17 MED ORDER — CELECOXIB 200 MG PO CAPS
400.0000 mg | ORAL_CAPSULE | ORAL | Status: AC
  Administered 2018-11-17: 400 mg via ORAL
  Filled 2018-11-17: qty 2

## 2018-11-17 MED ORDER — FENTANYL CITRATE (PF) 250 MCG/5ML IJ SOLN
INTRAMUSCULAR | Status: DC | PRN
  Administered 2018-11-17 (×5): 50 ug via INTRAVENOUS

## 2018-11-17 MED ORDER — TRAMADOL HCL 50 MG PO TABS: 50.0000 mg | ORAL_TABLET | Freq: Four times a day (QID) | ORAL | Status: DC | PRN

## 2018-11-17 MED ORDER — SUCCINYLCHOLINE CHLORIDE 200 MG/10ML IV SOSY
PREFILLED_SYRINGE | INTRAVENOUS | Status: AC
  Filled 2018-11-17: qty 20

## 2018-11-17 MED ORDER — SUGAMMADEX SODIUM 500 MG/5ML IV SOLN
INTRAVENOUS | Status: DC | PRN
  Administered 2018-11-17: 350 mg via INTRAVENOUS

## 2018-11-17 MED ORDER — SCOPOLAMINE 1 MG/3DAYS TD PT72
1.0000 | MEDICATED_PATCH | TRANSDERMAL | Status: DC
  Administered 2018-11-17: 1.5 mg via TRANSDERMAL
  Filled 2018-11-17: qty 1

## 2018-11-17 MED ORDER — MIDAZOLAM HCL 2 MG/2ML IJ SOLN
INTRAMUSCULAR | Status: AC
  Filled 2018-11-17: qty 2

## 2018-11-17 MED ORDER — SIMETHICONE 80 MG PO CHEW: 80.0000 mg | CHEWABLE_TABLET | Freq: Four times a day (QID) | ORAL | Status: DC | PRN

## 2018-11-17 MED ORDER — HYDROMORPHONE HCL 1 MG/ML IJ SOLN
0.5000 mg | Freq: Four times a day (QID) | INTRAMUSCULAR | Status: DC | PRN
  Administered 2018-11-17: 0.5 mg via INTRAVENOUS
  Filled 2018-11-17 (×2): qty 0.5

## 2018-11-17 MED ORDER — DIPHENHYDRAMINE HCL 50 MG/ML IJ SOLN
INTRAMUSCULAR | Status: DC | PRN
  Administered 2018-11-17: 12.5 mg via INTRAVENOUS

## 2018-11-17 MED ORDER — BUPIVACAINE-EPINEPHRINE 0.25% -1:200000 IJ SOLN
INTRAMUSCULAR | Status: DC | PRN
  Administered 2018-11-17: 30 mL

## 2018-11-17 MED ORDER — SUGAMMADEX SODIUM 200 MG/2ML IV SOLN
INTRAVENOUS | Status: AC
  Filled 2018-11-17: qty 2

## 2018-11-17 MED ORDER — PROPOFOL 10 MG/ML IV BOLUS
INTRAVENOUS | Status: DC | PRN
  Administered 2018-11-17: 20 mg via INTRAVENOUS
  Administered 2018-11-17: 180 mg via INTRAVENOUS

## 2018-11-17 MED ORDER — ENOXAPARIN SODIUM 30 MG/0.3ML ~~LOC~~ SOLN
30.0000 mg | Freq: Two times a day (BID) | SUBCUTANEOUS | Status: DC
  Administered 2018-11-17 – 2018-11-18 (×2): 30 mg via SUBCUTANEOUS
  Filled 2018-11-17 (×2): qty 0.3

## 2018-11-17 MED ORDER — ACETAMINOPHEN 500 MG PO TABS
1000.0000 mg | ORAL_TABLET | ORAL | Status: AC
  Administered 2018-11-17: 1000 mg via ORAL
  Filled 2018-11-17: qty 2

## 2018-11-17 SURGICAL SUPPLY — 68 items
APPLICATOR COTTON TIP 6 STRL (MISCELLANEOUS) IMPLANT
APPLICATOR COTTON TIP 6IN STRL (MISCELLANEOUS)
APPLIER CLIP ROT 10 11.4 M/L (STAPLE)
APPLIER CLIP ROT 13.4 12 LRG (CLIP)
BAG LAPAROSCOPIC 12 15 PORT 16 (BASKET) ×1 IMPLANT
BAG RETRIEVAL 12/15 (BASKET) ×2
BAG RETRIEVAL 12/15MM (BASKET) ×1
BANDAGE ADH SHEER 1  50/CT (GAUZE/BANDAGES/DRESSINGS) ×18 IMPLANT
BENZOIN TINCTURE PRP APPL 2/3 (GAUZE/BANDAGES/DRESSINGS) ×3 IMPLANT
BLADE SURG SZ11 CARB STEEL (BLADE) ×3 IMPLANT
CABLE HIGH FREQUENCY MONO STRZ (ELECTRODE) ×3 IMPLANT
CHLORAPREP W/TINT 26ML (MISCELLANEOUS) ×6 IMPLANT
CLIP APPLIE ROT 10 11.4 M/L (STAPLE) IMPLANT
CLIP APPLIE ROT 13.4 12 LRG (CLIP) IMPLANT
CLOSURE WOUND 1/2 X4 (GAUZE/BANDAGES/DRESSINGS) ×1
COVER SURGICAL LIGHT HANDLE (MISCELLANEOUS) ×3 IMPLANT
COVER WAND RF STERILE (DRAPES) ×3 IMPLANT
DEVICE SUT QUICK LOAD TK 5 (STAPLE) IMPLANT
DEVICE SUT TI-KNOT TK 5X26 (MISCELLANEOUS) IMPLANT
DEVICE TI KNOT TK5 (MISCELLANEOUS)
DRAPE UTILITY XL STRL (DRAPES) ×6 IMPLANT
ELECT REM PT RETURN 15FT ADLT (MISCELLANEOUS) ×3 IMPLANT
GAUZE SPONGE 4X4 12PLY STRL (GAUZE/BANDAGES/DRESSINGS) IMPLANT
GLOVE BIO SURGEON STRL SZ 6 (GLOVE) ×3 IMPLANT
GLOVE INDICATOR 6.5 STRL GRN (GLOVE) ×3 IMPLANT
GOWN STRL REUS W/TWL LRG LVL3 (GOWN DISPOSABLE) ×3 IMPLANT
GOWN STRL REUS W/TWL XL LVL3 (GOWN DISPOSABLE) ×9 IMPLANT
GRASPER SUT TROCAR 14GX15 (MISCELLANEOUS) ×3 IMPLANT
HOVERMATT SINGLE USE (MISCELLANEOUS) ×3 IMPLANT
KIT BASIN OR (CUSTOM PROCEDURE TRAY) ×3 IMPLANT
KIT PRC NS LF DISP ENDO (KITS) ×1 IMPLANT
KIT PROCEDURE OLYMPUS (KITS) ×2
MARKER SKIN DUAL TIP RULER LAB (MISCELLANEOUS) ×3 IMPLANT
NEEDLE SPNL 22GX3.5 QUINCKE BK (NEEDLE) ×3 IMPLANT
PACK UNIVERSAL I (CUSTOM PROCEDURE TRAY) ×3 IMPLANT
QUICK LOAD TK 5 (STAPLE)
RELOAD ENDO STITCH (ENDOMECHANICALS) IMPLANT
RELOAD STAPLER BLUE 60MM (STAPLE) ×4 IMPLANT
RELOAD STAPLER GOLD 60MM (STAPLE) ×1 IMPLANT
RELOAD STAPLER GREEN 60MM (STAPLE) ×1 IMPLANT
SCISSORS LAP 5X45 EPIX DISP (ENDOMECHANICALS) IMPLANT
SET IRRIG TUBING LAPAROSCOPIC (IRRIGATION / IRRIGATOR) ×3 IMPLANT
SHEARS HARMONIC ACE PLUS 45CM (MISCELLANEOUS) ×3 IMPLANT
SLEEVE ADV FIXATION 5X100MM (TROCAR) ×6 IMPLANT
SLEEVE GASTRECTOMY 40FR VISIGI (MISCELLANEOUS) ×3 IMPLANT
SOLUTION ANTI FOG 6CC (MISCELLANEOUS) ×3 IMPLANT
SPONGE LAP 18X18 RF (DISPOSABLE) ×3 IMPLANT
STAPLER ECHELON BIOABSB 60 FLE (MISCELLANEOUS) ×12 IMPLANT
STAPLER ECHELON LONG 60 440 (INSTRUMENTS) ×3 IMPLANT
STAPLER RELOAD BLUE 60MM (STAPLE) ×12
STAPLER RELOAD GOLD 60MM (STAPLE) ×3
STAPLER RELOAD GREEN 60MM (STAPLE) ×3
STRIP CLOSURE SKIN 1/2X4 (GAUZE/BANDAGES/DRESSINGS) ×2 IMPLANT
SUT MNCRL AB 4-0 PS2 18 (SUTURE) ×3 IMPLANT
SUT SURGIDAC NAB ES-9 0 48 120 (SUTURE) IMPLANT
SUT VICRYL 0 TIES 12 18 (SUTURE) ×3 IMPLANT
SYR 10ML ECCENTRIC (SYRINGE) ×3 IMPLANT
SYR 20CC LL (SYRINGE) ×3 IMPLANT
TOWEL OR 17X26 10 PK STRL BLUE (TOWEL DISPOSABLE) ×3 IMPLANT
TOWEL OR NON WOVEN STRL DISP B (DISPOSABLE) ×3 IMPLANT
TROCAR ADV FIXATION 5X100MM (TROCAR) ×3 IMPLANT
TROCAR BLADELESS 15MM (ENDOMECHANICALS) ×3 IMPLANT
TROCAR BLADELESS OPT 5 100 (ENDOMECHANICALS) ×3 IMPLANT
TUBE CALIBRATION LAPBAND (TUBING) ×3 IMPLANT
TUBING CONNECTING 10 (TUBING) ×2 IMPLANT
TUBING CONNECTING 10' (TUBING) ×1
TUBING ENDO SMARTCAP (MISCELLANEOUS) ×3 IMPLANT
TUBING INSUF HEATED (TUBING) ×3 IMPLANT

## 2018-11-17 NOTE — Progress Notes (Signed)
Patient unable to begin waters as ordered, too lethargic, pain not controlled at this time

## 2018-11-17 NOTE — Op Note (Signed)
Monica Walton [id number] 01/03/1971 11/17/2018  Preoperative diagnosis: sleeve gastrectomy in progress  Postoperative diagnosis: Same   Procedure: Upper endoscopy   Surgeon: Susy FrizzleMatt B. Daphine DeutscherMartin  M.D., FACS   Anesthesia: Gen.   Indications for procedure: This patient was undergoing a sleeve gastrectomy by Dr. Fredricka Bonineonnor.    Description of procedure: The endoscopy was placed in the mouth and into the oropharynx and under endoscopic vision it was advanced to the esophagogastric junction.  The pouch was insufflated and the scope was passed into the antrum without difficulty.  The sleeve was symmetric. .   No bleeding or leaks were detected.  The scope was withdrawn without difficulty.     Matt B. Daphine DeutscherMartin, MD, FACS General, Bariatric, & Minimally Invasive Surgery [facility], [place]

## 2018-11-17 NOTE — Discharge Instructions (Signed)
° ° ° °GASTRIC BYPASS/SLEEVE ° Home Care Instructions ° ° These instructions are to help you care for yourself when you go home. ° °Call: If you have any problems. °• Call [phone] and ask for the surgeon on call °• If you need immediate help, come to the ER at [place].  °• Tell the ER staff that you are a new post-op gastric bypass or gastric sleeve patient °  °Signs and symptoms to report: • Severe vomiting or nausea °o If you cannot keep down clear liquids for longer than 1 day, call your surgeon  °• Abdominal pain that does not get better after taking your pain medication °• Fever over 100.4° F with chills °• Heart beating over 100 beats a minute °• Shortness of breath at rest °• Chest pain °•  Redness, swelling, drainage, or foul odor at incision (surgical) sites °•  If your incisions open or pull apart °• Swelling or pain in calf (lower leg) °• Diarrhea (Loose bowel movements that happen often), frequent watery, uncontrolled bowel movements °• Constipation, (no bowel movements for 3 days) if this happens: Pick one °o Milk of Magnesia, 2 tablespoons by mouth, 3 times a day for 2 days if needed °o Stop taking Milk of Magnesia once you have a bowel movement °o Call your doctor if constipation continues °Or °o Miralax  (instead of Milk of Magnesia) following the label instructions °o Stop taking Miralax once you have a bowel movement °o Call your doctor if constipation continues °• Anything you think is not normal °  °Normal side effects after surgery: • Unable to sleep at night or unable to focus °• Irritability or moody °• Being tearful (crying) or depressed °These are common complaints, possibly related to your anesthesia medications that put you to sleep, stress of surgery, and change in lifestyle.  This usually goes away a few weeks after surgery.  If these feelings continue, call your primary care doctor. °  °Wound Care: You may have surgical glue, steri-strips, or staples over your incisions after  surgery °• Surgical glue:  Looks like a clear film over your incisions and will wear off a little at a time °• Steri-strips: Strips of tape over your incisions. You may notice a yellowish color on the skin under the steri-strips. This is used to make the   steri-strips stick better. Do not pull the steri-strips off - let them fall off °• Staples: Staples may be removed before you leave the hospital °o If you go home with staples, call [facility], [phone] at for an appointment with your surgeon’s nurse to have staples removed 10 days after surgery. °• Showering: You may shower two (2) days after your surgery unless your surgeon tells you differently °o Wash gently around incisions with warm soapy water, rinse well, and gently pat dry  °o No tub baths until staples are removed, steri-strips fall off or glue is gone.  °  °Medications: • Medications should be liquid or crushed if larger than the size of a dime °• Extended release pills (medication that release a little bit at a time through the day) should NOT be crushed or cut. (examples include XL, ER, DR, SR) °• Depending on the size and number of medications you take, you may need to space (take a few throughout the day)/change the time you take your medications so that you do not over-fill your pouch (smaller stomach) °• Make sure you follow-up with your primary care doctor to   make medication changes needed during rapid weight loss and life-style changes °• If you have diabetes, follow up with the doctor that orders your diabetes medication(s) within one week after surgery and check your blood sugar regularly. °• Do not drive while taking prescription pain medication  °• It is ok to take Tylenol by the bottle instructions with your pain medicine or instead of your pain medicine as needed.  DO NOT TAKE NSAIDS (EXAMPLES OF NSAIDS:  IBUPROFREN/ NAPROXEN)  °Diet:                    First 2 Weeks ° You will see the dietician t about two (2) weeks  after your surgery. The dietician will increase the types of foods you can eat if you are handling liquids well: °• If you have severe vomiting or nausea and cannot keep down clear liquids lasting longer than 1 day, call your surgeon @ ([phone]) °Protein Shake °• Drink at least 2 ounces of shake 5-6 times per day °• Each serving of protein shakes (usually 8 - 12 ounces) should have: °o 15 grams of protein  °o And no more than 5 grams of carbohydrate  °• Goal for protein each day: °o Men = 80 grams per day °o Women = 60 grams per day °• Protein powder may be added to fluids such as non-fat milk or Lactaid milk or unsweetened Soy/Almond milk (limit to 35 grams added protein powder per serving) ° °Hydration °• Slowly increase the amount of water and other clear liquids as tolerated (See Acceptable Fluids) °• Slowly increase the amount of protein shake as tolerated  °•  Sip fluids slowly and throughout the day.  Do not use straws. °• May use sugar substitutes in small amounts (no more than 6 - 8 packets per day; i.e. Splenda) ° °Fluid Goal °• The first goal is to drink at least 8 ounces of protein shake/drink per day (or as directed by the nutritionist); some examples of protein shakes are Syntrax Nectar, Adkins Advantage, EAS Edge HP, and Unjury. See handout from pre-op Bariatric Education Class: °o Slowly increase the amount of protein shake you drink as tolerated °o You may find it easier to slowly sip shakes throughout the day °o It is important to get your proteins in first °• Your fluid goal is to drink 64 - 100 ounces of fluid daily °o It may take a few weeks to build up to this °• 32 oz (or more) should be clear liquids  °And  °• 32 oz (or more) should be full liquids (see below for examples) °• Liquids should not contain sugar, caffeine, or carbonation ° °Clear Liquids: °• Water or Sugar-free flavored water (i.e. Fruit H2O, Propel) °• Decaffeinated coffee or tea (sugar-free) °• Crystal Lite, Wyler’s Lite,  Minute Maid Lite °• Sugar-free Jell-O °• Bouillon or broth °• Sugar-free Popsicle:   *Less than 20 calories each; Limit 1 per day ° °Full Liquids: °Protein Shakes/Drinks + 2 choices per day of other full liquids °• Full liquids must be: °o No More Than 15 grams of Carbs per serving  °o No More Than 3 grams of Fat per serving °• Strained low-fat cream soup (except Cream of Potato or Tomato) °• Non-Fat milk °• Fat-free Lactaid Milk °• Unsweetened Soy Or Unsweetened Almond Milk °• Low Sugar yogurt (Dannon Lite & Fit, Greek yogurt; Oikos Triple Zero; Chobani Simply 100; Yoplait 100 calorie Greek - No Fruit on the Bottom) ° °  °Vitamins   and Minerals • Start 1 day after surgery unless otherwise directed by your surgeon °• 2 Chewable Bariatric Specific Multivitamin / Multimineral Supplement with iron (Example: Bariatric Advantage Multi EA) °• Chewable Calcium with Vitamin D-3 °(Example: 3 Chewable Calcium Plus 600 with Vitamin D-3) °o Take 500 mg three (3) times a day for a total of 1500 mg each day °o Do not take all 3 doses of calcium at one time as it may cause constipation, and you can only absorb 500 mg  at a time  °o Do not mix multivitamins containing iron with calcium supplements; take 2 hours apart °• Menstruating women and those with a history of anemia (a blood disease that causes weakness) may need extra iron °o Talk with your doctor to see if you need more iron °• Do not stop taking or change any vitamins or minerals until you talk to your dietitian or surgeon °• Your Dietitian and/or surgeon must approve all vitamin and mineral supplements °  °Activity and Exercise: Limit your physical activity as instructed by your doctor.  It is important to continue walking at home.  During this time, use these guidelines: °• Do not lift anything greater than ten (10) pounds for at least two (2) weeks °• Do not go back to work or drive until your surgeon says you can °• You may have sex when you feel comfortable  °o It is  VERY important for female patients to use a reliable birth control method; fertility often increases after surgery  °o All hormonal birth control will be ineffective for 30 days after surgery due to medications given during surgery a barrier method must be used. °o Do not get pregnant for at least 18 months °• Start exercising as soon as your doctor tells you that you can °o Make sure your doctor approves any physical activity °• Start with a simple walking program °• Walk 5-15 minutes each day, 7 days per week.  °• Slowly increase until you are walking 30-45 minutes per day °Consider joining our BELT program. [phone] or email belt@[employer] °  °Special Instructions Things to remember: °• Use your CPAP when sleeping if this applies to you ° °• [facility] has two free Bariatric Surgery Support Groups that meet monthly °o The 3rd Thursday of each month, 6 pm, [facility]  °o The 2nd Friday of each month, 11:45 am in the private dining room in the basement of [facility] °• It is very important to keep all follow up appointments with your surgeon, dietitian, primary care physician, and behavioral health practitioner °• Routine follow up schedule with your surgeon include appointments at 2-3 weeks, 6-8 weeks, 6 months, and 1 year at a minimum.  Your surgeon may request to see you more often.   °o After the first year, please follow up with your bariatric surgeon and dietitian at least once a year in order to maintain best weight loss results °[facility]: [phone] °[facility]: [phone] °Bariatric Nurse Coordinator: [phone] °  °   Reviewed and Endorsed  °by [facility] Patient Education Committee, June, 2016 °Edits Approved: Aug, 2018 ° ° ° °

## 2018-11-17 NOTE — Progress Notes (Signed)
Patient with upper abd and gas pain.  Bedside RN gave pain medication.  Heat pack applied.  Questions answered.

## 2018-11-17 NOTE — Transfer of Care (Signed)
Immediate Anesthesia Transfer of Care Note  Patient: Monica Walton  Procedure(s) Performed: LAPAROSCOPIC GASTRIC SLEEVE RESECTION, UPPER ENDO, ERAS Pathwya (N/A )  Patient Location: PACU  Anesthesia Type:General  Level of Consciousness: awake and alert   Airway & Oxygen Therapy: Patient Spontanous Breathing and Patient connected to face mask oxygen  Post-op Assessment: Report given to RN and Post -op Vital signs reviewed and stable  Post vital signs: Reviewed and stable  Last Vitals:  Vitals Value Taken Time  BP 149/77 11/17/2018 10:00 AM  Temp    Pulse 88 11/17/2018 10:01 AM  Resp 15 11/17/2018 10:01 AM  SpO2 100 % 11/17/2018 10:01 AM  Vitals shown include unvalidated device data.  Last Pain:  Vitals:   11/17/18 0558  TempSrc: Oral         Complications: No apparent anesthesia complications

## 2018-11-17 NOTE — Progress Notes (Signed)
PHARMACY CONSULT FOR:  Risk Assessment for Post-Discharge VTE Following Bariatric Surgery  Post-Discharge VTE Risk Assessment: This patient's probability of 30-day post-discharge VTE is increased due to the factors marked:   Female    Age >/=60 years    BMI >/=50 kg/m2    CHF    Dyspnea at Rest    Paraplegia  X  Non-gastric-band surgery    Operation Time >/=3 hr    Return to OR     Length of Stay >/= 3 d   Predicted probability of 30-day post-discharge VTE: 0.16%  Other patient-specific factors to consider:   Recommendation for Discharge: No pharmacologic prophylaxis post-discharge  Monica Walton is a 47 y.o. female who underwent  Gastric sleeve resection on 11/18/2019   Case start: 0831 Case end: 0945   Allergies  Allergen Reactions  . Kiwi Extract Itching  . Nickel Nausea And Vomiting  . Penicillins     Childhood rxn    Patient Measurements: Height: 5\' 5"  (165.1 cm) Weight: 256 lb 2 oz (116.2 kg) IBW/kg (Calculated) : 57 Body mass index is 42.62 kg/m.  No results for input(s): WBC, HGB, HCT, PLT, APTT, CREATININE, LABCREA, CREATININE, CREAT24HRUR, MG, PHOS, ALBUMIN, PROT, ALBUMIN, AST, ALT, ALKPHOS, BILITOT, BILIDIR, IBILI in the last 72 hours. Estimated Creatinine Clearance: 110.8 mL/min (by C-G formula based on SCr of 0.61 mg/dL).  Past Medical History:  Diagnosis Date  . Arthritis    lower lumbar arthritis   . Blighted ovum 03/31/2013   [facility] w/ no ys and no fetal pole, measuring 1949w2d (vs 6163w2d LMP); seen by EP, P.A. 03/29/13 for VB  . BV (bacterial vaginosis)   . GERD (gastroesophageal reflux disease)   . Seasonal asthma   . Spontaneous miscarriage 03/31/2013  . UTI (lower urinary tract infection)    Medications Prior to Admission  Medication Sig Dispense Refill Last Dose  . Biotin w/ Vitamins C & E (HAIR/SKIN/NAILS PO) Take 1 tablet by mouth daily.   11/16/2018 at Unknown time  . Multiple Vitamin (MULTIVITAMIN WITH  MINERALS) TABS tablet Take 1 tablet by mouth daily.   11/16/2018 at Unknown time  . norgestimate-ethinyl estradiol (ORTHO-CYCLEN,SPRINTEC,PREVIFEM) 0.25-35 MG-MCG tablet Take 1 tablet by mouth daily.     . sodium fluoride (SF 5000 PLUS) 1.1 % CREA dental cream Place 1 application onto teeth at bedtime.      Otho BellowsGreen, Sotirios Navarro L PharmD Pager [phone] 11/17/2018, 12:23 PM

## 2018-11-17 NOTE — Progress Notes (Deleted)
Pt transferred to room 1530, report given to Cathleen Cortierri Thurman, RN

## 2018-11-17 NOTE — Anesthesia Procedure Notes (Signed)
Procedure Name: Intubation Date/Time: 11/17/2018 8:14 AM Performed by: Minerva EndsMirarchi, Matia Zelada M, CRNA Pre-anesthesia Checklist: Patient identified, Emergency Drugs available, Suction available and Patient being monitored Patient Re-evaluated:Patient Re-evaluated prior to induction Oxygen Delivery Method: Circle System Utilized Preoxygenation: Pre-oxygenation with 100% oxygen Induction Type: IV induction Ventilation: Mask ventilation without difficulty Laryngoscope Size: Miller and 2 Grade View: Grade I Tube type: Oral Tube size: 7.0 mm Number of attempts: 1 Airway Equipment and Method: Stylet Placement Confirmation: ETT inserted through vocal cords under direct vision,  positive ETCO2 and breath sounds checked- equal and bilateral Secured at: 21 cm Tube secured with: Tape Dental Injury: Teeth and Oropharynx as per pre-operative assessment  Comments: Smooth IV induction Singer-- intubation AM CRNA atraumatic-- teeth and mouth as preop-- chipped left front tooth upon AW exam after induction- pt did not report on preanes assessment-- tooth unchanged after laryngoscopy-- dental advisory given by CRNA preop, bilat BS Krista BlueSinger

## 2018-11-17 NOTE — Anesthesia Postprocedure Evaluation (Signed)
Anesthesia Post Note  Patient: Adrienne MochaStephanie Moore Baray  Procedure(s) Performed: LAPAROSCOPIC GASTRIC SLEEVE RESECTION, UPPER ENDO, ERAS Pathwya (N/A )     Patient location during evaluation: PACU Anesthesia Type: General Level of consciousness: sedated Pain management: pain level controlled Vital Signs Assessment: post-procedure vital signs reviewed and stable Respiratory status: spontaneous breathing and respiratory function stable Cardiovascular status: stable Postop Assessment: no apparent nausea or vomiting Anesthetic complications: no    Last Vitals:  Vitals:   11/17/18 1115 11/17/18 1144  BP: 131/70 132/75  Pulse: 77 79  Resp: 17 14  Temp: 36.6 C 36.8 C  SpO2: 98% 98%    Last Pain:  Vitals:   11/17/18 1115  TempSrc:   PainSc: Asleep                 Plez [place]ton DANIEL

## 2018-11-17 NOTE — Anesthesia Procedure Notes (Signed)
Date/Time: 11/17/2018 9:50 AM Performed by: Minerva EndsMirarchi, Maleta Pacha M, CRNA Oxygen Delivery Method: Simple face mask Placement Confirmation: positive ETCO2 and breath sounds checked- equal and bilateral Dental Injury: Teeth and Oropharynx as per pre-operative assessment

## 2018-11-17 NOTE — Interval H&P Note (Signed)
History and Physical Interval Note:  11/17/2018 7:07 AM  Monica Walton  has presented today for surgery, with the diagnosis of Morbid Obesity, Hypercholesterolemia, Low Back Pain  The various methods of treatment have been discussed with the patient and family. After consideration of risks, benefits and other options for treatment, the patient has consented to  Procedure(s): LAPAROSCOPIC GASTRIC SLEEVE RESECTION, UPPER ENDO, ERAS Pathwya (N/A) as a surgical intervention .  The patient's history has been reviewed, patient examined, no change in status, stable for surgery.  I have reviewed the patient's chart and labs.  Questions were answered to the patient's satisfaction.     [name] [name]

## 2018-11-17 NOTE — Progress Notes (Signed)
Patient transferring to room 1535 report given to Lorenz CoasterAmy Abernathy, RN

## 2018-11-17 NOTE — Op Note (Signed)
Operative Note  Monica Walton  [id number]  [id number]  11/17/2018   Surgeon: Lady Deutscher ConnorMD  Assistant: Wenda Low MD  Procedure performed: laparoscopic sleeve gastrectomy, upper endoscopy  Preop diagnosis: Morbid obesity Body mass index is 42.62 kg/m. Post-op diagnosis/intraop findings: same  Specimens: fundus Retained items: none EBL: 25cc Complications: none  Description of procedure: After obtaining informed consent and administration of prophylactic lovenox in holding, the patient was taken to the operating room and placed supine on operating room table wheregeneral endotracheal anesthesia was initiated, preoperative antibiotics were administered, SCDs applied, and a formal timeout was performed. The abdomen was prepped and draped in usual sterile fashion. Peritoneal access was gained using a Visiport technique in the left upper quadrant and insufflation to 15 mmHg ensued without issue. Gross inspection revealed no evidence of injury. There are some omental adhesions in the right upper quadrant and periumbilical which were not disturbed. Under direct visualization three more 5 mm trochars were placed in the right and left hemiabdomen and the 15mm trocar in the right paramedian upper abdomen. Bilateral laparoscopic assisted TAPS blocks were performed with Exparel diluted with 0.25 percent Marcaine with epinephrine. The patient was placed in steep Trendelenburg and the liver retractor was introduced through an incision in the upper midline and secured to the post externally to maintain the left lobe retracted anteriorly. There was mention of a small sliding hiatal hernia on her preop upper GI though the patient does not have symptomatic reflux. The calibration tubing was passed down by the anesthesiologist and the balloon inflated to 10 mL, this was then gently retracted and noted to meet resistance at the hiatus. Given this finding and lack of reflux symptoms the  hiatus was left undisturbed. The calibration tubing was then deflated, removed and discarded. Using the Harmonic scalpel, the greater curvature of the stomach was dissected away from the greater omentum and short gastric vessels were divided. This began 6 cm from the pylorus, and dissection proceeded until the left crus was clearly exposed. There were some filmy adhesions of the posterior stomach to the pancreas which were taken down the Harmonic. The 96 [place] VisiGi was then introduced and directed down towards the pylorus. This was placed to suction against the lesser curve. Serial fires of the linear cutting stapler with seamguards were then employed to create our sleeve. The first fire used a green load and ensured adequate room at the angularis incisura. One gold load and then 4 blue loads were then employed to create a narrow tubular stomach up to the angle of His staying just lateral to the fat pad. The excised stomach was then removed through our 15 mm trocar site within an Endo Catch bag. The visigi was taken off of suction and a few puffs of air were introduced, inflating the sleeve. No bubbles were observed and the irrigation fluid around the stomach and the shape was noted to be a nice smooth tube without any narrowing at the angularis. The visigi was then removed. Upper endoscopy was performed by the assistant surgeon and the sleeve was noted to be airtight, the staple line was hemostatic. Please see his separate note. The endoscope was removed. The abdomen was surveyed once more and hemostasis confirmed. The 15 mm trocar site fascia in the right upper abdomen was closed with a 0 Vicryl using the laparoscopic suture passer under direct visualization. The liver retractor was removed under direct visualization. The abdomen was then desufflated and all remaining trochars removed. The skin incisions  were closed with running subcuticular Monocryl; benzoin, Steri-Strips and Band-Aids were applied The patient  was then awakened, extubated and taken to PACU in stable condition.    All counts were correct at the completion of the case.

## 2018-11-18 ENCOUNTER — Encounter (HOSPITAL_COMMUNITY): Payer: Self-pay | Admitting: Surgery

## 2018-11-18 LAB — CBC WITH DIFFERENTIAL/PLATELET
Abs Immature Granulocytes: 0.05 10*3/uL (ref 0.00–0.07)
Basophils Absolute: 0 10*3/uL (ref 0.0–0.1)
Basophils Relative: 0 %
EOS ABS: 0 10*3/uL (ref 0.0–0.5)
EOS PCT: 0 %
HCT: 42.1 % (ref 36.0–46.0)
Hemoglobin: 13.4 g/dL (ref 12.0–15.0)
IMMATURE GRANULOCYTES: 0 %
Lymphocytes Relative: 19 %
Lymphs Abs: 2.9 10*3/uL (ref 0.7–4.0)
MCH: 29.3 pg (ref 26.0–34.0)
MCHC: 31.8 g/dL (ref 30.0–36.0)
MCV: 91.9 fL (ref 80.0–100.0)
Monocytes Absolute: 1.2 10*3/uL — ABNORMAL HIGH (ref 0.1–1.0)
Monocytes Relative: 8 %
Neutro Abs: 10.8 10*3/uL — ABNORMAL HIGH (ref 1.7–7.7)
Neutrophils Relative %: 73 %
Platelets: 370 10*3/uL (ref 150–400)
RBC: 4.58 MIL/uL (ref 3.87–5.11)
RDW: 12.1 % (ref 11.5–15.5)
WBC: 15 10*3/uL — ABNORMAL HIGH (ref 4.0–10.5)
nRBC: 0 % (ref 0.0–0.2)

## 2018-11-18 LAB — COMPREHENSIVE METABOLIC PANEL
ALT: 34 U/L (ref 0–44)
ANION GAP: 8 (ref 5–15)
AST: 54 U/L — ABNORMAL HIGH (ref 15–41)
Albumin: 3.8 g/dL (ref 3.5–5.0)
Alkaline Phosphatase: 25 U/L — ABNORMAL LOW (ref 38–126)
BUN: 8 mg/dL (ref 6–20)
CHLORIDE: 105 mmol/L (ref 98–111)
CO2: 23 mmol/L (ref 22–32)
Calcium: 8.2 mg/dL — ABNORMAL LOW (ref 8.9–10.3)
Creatinine, Ser: 0.58 mg/dL (ref 0.44–1.00)
GFR calc Af Amer: >60 mL/min (ref >60)
GFR calc non Af Amer: >60 mL/min (ref >60)
Glucose, Bld: 134 mg/dL — ABNORMAL HIGH (ref 70–99)
Potassium: 4.9 mmol/L (ref 3.5–5.1)
Sodium: 136 mmol/L (ref 135–145)
Total Bilirubin: 0.9 mg/dL (ref 0.3–1.2)
Total Protein: 6.7 g/dL (ref 6.5–8.1)

## 2018-11-18 LAB — MAGNESIUM: Magnesium: 1.8 mg/dL (ref 1.7–2.4)

## 2018-11-18 MED ORDER — ONDANSETRON 4 MG PO TBDP: 4.0000 mg | ORAL_TABLET | Freq: Four times a day (QID) | ORAL | 0 refills | Status: AC | PRN

## 2018-11-18 MED ORDER — GABAPENTIN 300 MG PO CAPS: 300.0000 mg | ORAL_CAPSULE | Freq: Three times a day (TID) | ORAL | 0 refills | Status: AC

## 2018-11-18 MED ORDER — OXYCODONE HCL 5 MG/5ML PO SOLN: 5.0000 mg | Freq: Four times a day (QID) | ORAL | 0 refills | Status: AC | PRN

## 2018-11-18 MED ORDER — DOCUSATE SODIUM 100 MG PO CAPS: 100.0000 mg | ORAL_CAPSULE | Freq: Two times a day (BID) | ORAL | 0 refills | Status: AC

## 2018-11-18 MED ORDER — PANTOPRAZOLE SODIUM 40 MG PO TBEC: 40.0000 mg | DELAYED_RELEASE_TABLET | Freq: Every day | ORAL | 0 refills | Status: AC

## 2018-11-18 MED ORDER — ACETAMINOPHEN 160 MG/5ML PO SOLN: 650.0000 mg | Freq: Four times a day (QID) | ORAL | 0 refills | Status: AC | PRN

## 2018-11-18 NOTE — Discharge Summary (Signed)
Physician Discharge Summary  Monica Walton OFB:[id number] DOB: 03/01/1971 DOA: 11/17/2018  PCP: [name], MD  Admit date: 11/17/2018 Discharge date: 11/18/2018 11/18/2018   Recommendations for Outpatient Follow-up:   Follow-up Information    [name], MD. Go on 12/17/2018.   Specialty:  General Surgery Why:  at 1155 Contact information: [address] [place] [address] [phone]        [name], MD .   Specialty:  General Surgery Contact information: [address] [place] [address] [phone]          Discharge Diagnoses:  Active Problems:   Morbid obesity ([place])   Surgical Procedure: Laparoscopic Sleeve Gastrectomy, upper endoscopy  Discharge Condition: Good Disposition: Home  Diet recommendation: Postoperative sleeve gastrectomy diet (liquids only)  Filed Weights   11/17/18 0558  Weight: 116.2 kg     Hospital Course:  The patient was admitted for a planned laparoscopic sleeve gastrectomy. Please see operative note. Preoperatively the patient was given lovenox for DVT prophylaxis. Postoperative prophylactic Lovenox dosing was started on the evening of postoperative day 0. ERAS protocol was used. On the evening of postoperative day 0, the patient was started on water and ice chips. On postoperative day 1 the patient had no fever or tachycardia and was tolerating water in their diet was gradually advanced throughout the day. The patient was ambulating without difficulty. Their vital signs are stable without fever or tachycardia. Their hemoglobin had remained stable. The patient had received discharge instructions and counseling. They were deemed stable for discharge and had met discharge criteria   Discharge Instructions  Discharge Instructions    Ambulate hourly while awake   Complete by:  As directed    Call MD for:  difficulty breathing, headache or visual disturbances    Complete by:  As directed    Call MD for:  persistant dizziness or light-headedness   Complete by:  As directed    Call MD for:  persistant nausea and vomiting   Complete by:  As directed    Call MD for:  redness, tenderness, or signs of infection (pain, swelling, redness, odor or green/yellow discharge around incision site)   Complete by:  As directed    Call MD for:  severe uncontrolled pain   Complete by:  As directed    Call MD for:  temperature >101 F   Complete by:  As directed    Diet bariatric full liquid   Complete by:  As directed    Incentive spirometry   Complete by:  As directed    Perform hourly while awake     Allergies as of 11/18/2018      Reactions   Kiwi Extract Itching   Nickel Nausea And Vomiting   Penicillins    Childhood rxn      Medication List    STOP taking these medications   norgestimate-ethinyl estradiol 0.25-35 MG-MCG tablet Commonly known as:  ORTHO-CYCLEN,SPRINTEC,PREVIFEM     TAKE these medications   acetaminophen 160 MG/5ML solution Commonly known as:  TYLENOL Take 20.3 mLs (650 mg total) by mouth every 6 (six) hours as needed.   docusate sodium 100 MG capsule Commonly known as:  COLACE Take 1 capsule (100 mg total) by mouth 2 (two) times daily.   gabapentin 300 MG capsule Commonly known as:  NEURONTIN Take 1 capsule (300 mg total) by mouth every 8 (eight) hours. for 30 days   HAIR/SKIN/NAILS PO Take 1 tablet  by mouth daily.   multivitamin with minerals Tabs tablet Take 1 tablet by mouth daily.   ondansetron 4 MG disintegrating tablet Commonly known as:  ZOFRAN-ODT Take 1 tablet (4 mg total) by mouth every 6 (six) hours as needed for nausea or vomiting.   oxyCODONE 5 MG/5ML solution Commonly known as:  ROXICODONE Take 5 mLs (5 mg total) by mouth every 6 (six) hours as needed for severe pain.   pantoprazole 40 MG tablet Commonly known as:  PROTONIX Take 1 tablet (40 mg total) by mouth daily.   SF 5000 PLUS 1.1 % Crea  dental cream Generic drug:  sodium fluoride Place 1 application onto teeth at bedtime.      Follow-up Information    [name], MD. Go on 12/17/2018.   Specialty:  General Surgery Why:  at 1155 Contact information: [address] [place] [address] [phone]        [name], MD .   Specialty:  General Surgery Contact information: [address] [place] [address] [phone]            The results of significant diagnostics from this hospitalization (including imaging, microbiology, ancillary and laboratory) are listed below for reference.    Significant Diagnostic Studies: Mm 3d Screen Breast Bilateral  Result Date: 10/26/2018 CLINICAL DATA:  Screening. EXAM: DIGITAL SCREENING BILATERAL MAMMOGRAM WITH TOMO AND CAD COMPARISON:  Previous exam(s). ACR Breast Density Category c: The breast tissue is heterogeneously dense, which may obscure small masses. FINDINGS: There are no findings suspicious for malignancy. Images were processed with CAD. IMPRESSION: No mammographic evidence of malignancy. A result letter of this screening mammogram will be mailed directly to the patient. RECOMMENDATION: Screening mammogram in one year. (Code:SM-B-01Y) BI-RADS CATEGORY  1: Negative. Electronically Signed   By: Dorise Bullion III M.D   On: 10/26/2018 09:25    Labs: Basic Metabolic Panel: Recent Labs  Lab 11/13/18 0833 11/18/18 0534  NA 139 136  K 4.3 4.9  CL 105 105  CO2 25 23  GLUCOSE 106* 134*  BUN 12 8  CREATININE 0.61 0.58  CALCIUM 9.2 8.2*  MG  --  1.8   Liver Function Tests: Recent Labs  Lab 11/13/18 0833 11/18/18 0534  AST 24 54*  ALT 26 34  ALKPHOS 27* 25*  BILITOT 0.4 0.9  PROT 7.1 6.7  ALBUMIN 4.0 3.8    CBC: Recent Labs  Lab 11/13/18 0833 11/18/18 0750  WBC 8.2 15.0*  NEUTROABS 4.5 10.8*  HGB 14.7 13.4  HCT 45.3 42.1  MCV 91.3 91.9  PLT 393 370    CBG: No results for  input(s): GLUCAP in the last 168 hours.  Active Problems:   Morbid obesity ([place])    Signed:  [name], [facility], [place] 11/18/2018, 3:18 PM

## 2018-11-18 NOTE — Plan of Care (Signed)

## 2018-11-18 NOTE — Progress Notes (Signed)
Patient alert and oriented, pain is controlled. Patient is tolerating fluids, advanced to protein shake today, patient is tolerating well.  Reviewed Gastric sleeve discharge instructions with patient and patient is able to articulate understanding.  Provided information on BELT program, Support Group and [facility] outpatient pharmacy. All questions answered, will continue to monitor.  Total fluid intake 960 Per dehydration protocol call back one week post op 

## 2018-11-18 NOTE — Progress Notes (Signed)
S: Had a decent night, having gas pain throughout chest, back and right flank. Minimal nausea. Doing well with liquids.   Vitals, labs, intake/output, and orders reviewed at this time. Tmax 98.6, HR 96, BP 158/81, 97% RA. PO 720, UOP 2950. CMP unremarkable, WBC 15 (8.2), HGB 14.3 (14.7), PTL 370 (393)  Gen: A&Ox3, no distress  H&N: EOMI, atraumatic, neck supple Chest: unlabored respirations, RRR Abd: soft, nontender, nondistended, incisions c/d/i no cellulitis or hematoma Ext: warm, no edema Neuro: grossly normal  Lines/tubes/drains: PIV  A/P:  POD 1 sleeve gastrectomy, doing well -Continue clears/ protein shakes -Encourage more ambulation, has only walked 2x last night -Continue IS, lovenox/SCDs -Discharge home today   Monica Blakeshelsea Burnis Kaser, MD [facility], [place] Pager [phone]

## 2018-11-18 NOTE — Progress Notes (Signed)
Discharge instructions given to pt and all questions were answered. Pt taken down via wheelchair and was picked up by her husband.  

## 2018-11-23 ENCOUNTER — Telehealth (HOSPITAL_COMMUNITY): Payer: Self-pay

## 2018-11-23 NOTE — Telephone Encounter (Signed)
Patient called to discuss post bariatric surgery follow up questions.  See below:   1.  Tell me about your pain and pain management?denies  2.  Let's talk about fluid intake.  How much total fluid are you taking in?45 ounces  3.  How much protein have you taken in the last 2 days?45  4.  Have you had nausea?  Tell me about when have experienced nausea and what you did to help?denies  5.  Has the frequency or color changed with your urine?color of straw no dizziness or headache  6.  Tell me what your incisions look like?look good a couple a sore  7.  Have you been passing gas? BM?passing gas has had BM everday  8.  If a problem or question were to arise who would you call?  Do you know contact numbers for [facility], [facility], and [facility]?aware of how to contact all services  9.  How has the walking going?walking fequently  10.  How are your vitamins and calcium going?  How are you taking them?does not like MVI discussed options available, calcium options discussed as well

## 2018-11-30 ENCOUNTER — Encounter: Payer: 59 | Attending: Surgery | Admitting: Skilled Nursing Facility1

## 2018-11-30 DIAGNOSIS — Z79899 Other long term (current) drug therapy: Secondary | ICD-10-CM | POA: Diagnosis not present

## 2018-11-30 DIAGNOSIS — E7800 Pure hypercholesterolemia, unspecified: Secondary | ICD-10-CM | POA: Insufficient documentation

## 2018-11-30 DIAGNOSIS — Z8249 Family history of ischemic heart disease and other diseases of the circulatory system: Secondary | ICD-10-CM | POA: Diagnosis not present

## 2018-11-30 DIAGNOSIS — Z713 Dietary counseling and surveillance: Secondary | ICD-10-CM | POA: Diagnosis present

## 2018-11-30 DIAGNOSIS — Z87891 Personal history of nicotine dependence: Secondary | ICD-10-CM | POA: Diagnosis not present

## 2018-11-30 DIAGNOSIS — E669 Obesity, unspecified: Secondary | ICD-10-CM

## 2018-11-30 DIAGNOSIS — Z6841 Body Mass Index (BMI) 40.0 and over, adult: Secondary | ICD-10-CM | POA: Insufficient documentation

## 2018-11-30 DIAGNOSIS — Z880 Allergy status to penicillin: Secondary | ICD-10-CM | POA: Diagnosis not present

## 2018-11-30 DIAGNOSIS — M1990 Unspecified osteoarthritis, unspecified site: Secondary | ICD-10-CM | POA: Diagnosis not present

## 2018-12-01 ENCOUNTER — Encounter: Payer: Self-pay | Admitting: Skilled Nursing Facility1

## 2018-12-01 NOTE — Progress Notes (Signed)
Bariatric Class:  Appt start time: 1530 end time:  1630.  2 Week Post-Operative Nutrition Class  Patient was seen on 11/30/2018 for Post-Operative Nutrition education at [facility].   Surgery date: 11/17/2018 Surgery type: sleeve Start weight at [facility]: 252.7 Weight today: 246  TANITA  BODY COMP RESULTS  11/30/2018   BMI (kg/m^2) 40.9   Fat Mass (lbs) 127.4   Fat Free Mass (lbs) 118.6   Total Body Water (lbs) 87   The following the learning objectives were met by the patient during this course:  Identifies Phase 3A (Soft, High Proteins) Dietary Goals and will begin from 2 weeks post-operatively to 2 months post-operatively  Identifies appropriate sources of fluids and proteins   States protein recommendations and appropriate sources post-operatively  Identifies the need for appropriate texture modifications, mastication, and bite sizes when consuming solids  Identifies appropriate multivitamin and calcium sources post-operatively  Describes the need for physical activity post-operatively and will follow MD recommendations  States when to call healthcare provider regarding medication questions or post-operative complications  Handouts given during class include:  Phase 3A: Soft, High Protein Diet Handout  Follow-Up Plan: Patient will follow-up at [facility] in 6 weeks for 2 month post-op nutrition visit for diet advancement per MD.

## 2018-12-07 ENCOUNTER — Telehealth: Payer: Self-pay | Admitting: Skilled Nursing Facility1

## 2018-12-07 NOTE — Telephone Encounter (Signed)
RD called pt to verify fluid intake once starting soft, solid proteins 2 week post-bariatric surgery.   Daily Fluid intake: 40 not including protein shakes Daily Protein intake: 60+  Concerns/issues:   Hot chocolate with peanut butter and protein powder

## 2019-01-15 ENCOUNTER — Encounter: Payer: 59 | Attending: Surgery | Admitting: Dietician

## 2019-01-15 ENCOUNTER — Encounter: Payer: Self-pay | Admitting: Dietician

## 2019-01-15 VITALS — Wt 225.6 lb

## 2019-01-15 DIAGNOSIS — M1990 Unspecified osteoarthritis, unspecified site: Secondary | ICD-10-CM | POA: Insufficient documentation

## 2019-01-15 DIAGNOSIS — E7800 Pure hypercholesterolemia, unspecified: Secondary | ICD-10-CM | POA: Diagnosis not present

## 2019-01-15 DIAGNOSIS — Z6841 Body Mass Index (BMI) 40.0 and over, adult: Secondary | ICD-10-CM | POA: Insufficient documentation

## 2019-01-15 DIAGNOSIS — Z87891 Personal history of nicotine dependence: Secondary | ICD-10-CM | POA: Diagnosis not present

## 2019-01-15 DIAGNOSIS — Z79899 Other long term (current) drug therapy: Secondary | ICD-10-CM | POA: Diagnosis not present

## 2019-01-15 DIAGNOSIS — Z8249 Family history of ischemic heart disease and other diseases of the circulatory system: Secondary | ICD-10-CM | POA: Diagnosis not present

## 2019-01-15 DIAGNOSIS — Z880 Allergy status to penicillin: Secondary | ICD-10-CM | POA: Diagnosis not present

## 2019-01-15 DIAGNOSIS — E669 Obesity, unspecified: Secondary | ICD-10-CM

## 2019-01-15 DIAGNOSIS — Z713 Dietary counseling and surveillance: Secondary | ICD-10-CM | POA: Diagnosis not present

## 2019-01-15 NOTE — Patient Instructions (Signed)
.   Continue to aim for a minimum of 64 fluid ounces 7 days a week with at least 30 ounces being plain water . Eat non-starchy vegetables 2 times a day 7 days a week . Start out with soft cooked vegetables today and tomorrow; if tolerated, begin to eat raw vegetables including salads . Per meal/snack, eat 3 ounces of protein first then start on non-starchy vegetables; once you understand how much of your meal leads to satisfaction (and not full) while still eating 3 ounces of protein and non-starchy vegetables, you can eat them in any order (figure out how much you can eat at a time to get enough but not too much)  . Continue to aim for 30 minutes of physical activity at least 5 times a week  

## 2019-01-15 NOTE — Progress Notes (Signed)
Bariatric Follow-Up Visit Medical Nutrition Therapy  Appt Start Time: 8:15am End Time: 8:40am  2 Months Post-Operative Sleeve Gastrectomy Surgery  Primary Concerns Today: Bariatric Nutrition Follow Up  Pt states she has had hiccups daily. RD encouraged pt to reach out to surgeon about this issue.    NUTRITION ASSESSMENT  Anthropometrics  Start weight at [facility]: 252.7 lbs (date: 09/08/2018) Today's weight: 225.6 lbs Weight change: -20.4 lbs (since previous visit on: 11/30/2018)   TANITA  Body Composition Results  11/30/2018 01/15/2019   BMI (kg/m2) 40.9 37.5   Fat Mass (lbs) 127.4 112.6   Fat Free Mass (lbs) 118.6 113   Total Body Water (lbs) 87 82.2   Total Body Water (%) - 36.4   Clinical Medical Hx: obesity, sleep apnea, vitamin D deficiency Medications: see list  24-Hr Dietary Recall First Meal: 1 hard boiled egg + 1 to 2 slices of bacon (or [employer] egg + sausage patty) Snack: applesauce  Second Meal: 1/4 cup [place] chili (or chopped BBQ)  Snack: almonds   Third Meal: air-fried meat Snack: 1/4 [employer] protein cookie  Beverages: water + unsweetened tea (w/ Splenda or Truvia) + Crystal Light flavor packets   Food & Nutrition Related Hx Dietary Hx: Pt states she has tolerated foods pretty well, but is able to get in more fluid than food. Pt states she has "cheated" some by eating applesauce and green beans. Pt states she drinks regular coffee occasionally, but otherwise avoids caffeine/carbonation/sugar-sweetened beverages.  Supplements: bariatric MVI (Opurity MVI w/ iron), calcium, hair/skin/nails  Estimated Daily Fluid Intake: 52+ oz Estimated Daily Protein Intake: 60+ g  Physical Activity  Current average weekly physical activity: water aerobics (2x/week) + walking at work    Post-Op Goals Using straws: sometimes Drinking while eating: sometimes Chewing/swallowing difficulties: no Changes in vision: no Changes to mood/headaches: no Hair loss/changes to skin/nails:  no Difficulty focusing/concentrating: no Sweating: no Dizziness/lightheadedness: no Palpitations: no  Carbonated/caffeinated beverages: caffeine (coffee) N/V/D/C/Gas: no Abdominal pain: no Dumping syndrome: no Last Lap-Band fill: N/A   NUTRITION DIAGNOSIS  Overweight/obesity ([place]-3.3) related to past poor dietary habits and physical inactivity as evidenced by patient w/ completed Sleeve Gastrectomy surgery following dietary guidelines for continued weight loss.   NUTRITION INTERVENTION Nutrition counseling (C-1) and education (E-2) to facilitate bariatric surgery goals, including: . Diet advancement to the next phase (phase IV) now including non-starchy vegetables . The importance of consuming adequate calories as well as certain nutrients daily due to the body's need for essential vitamins, minerals, and fats . The importance of daily physical activity and to reach a goal of at least 150 minutes of moderate to vigorous physical activity weekly (or as directed by their physician) due to benefits such as increased musculature and improved lab values  Handouts Provided Include   Phase IV: Protein + Non-Starchy Vegetables  Learning Style & Readiness for Change Teaching method utilized: Visual & Auditory  Demonstrated degree of understanding via: Teach Back  Barriers to learning/adherence to lifestyle change: None Identified    MONITORING & EVALUATION Dietary intake, weekly physical activity, body weight in 4 months.  Next Steps Patient is to follow-up in 4 months for 6 month post-op class.

## 2019-02-01 ENCOUNTER — Telehealth: Payer: Self-pay | Admitting: Skilled Nursing Facility1

## 2019-02-01 NOTE — Telephone Encounter (Signed)
Fasting for a week at church: 16 ounces of juice per day and water no food   Dietitian advised she follow the liquid diet incorporating 60 grams of protein daily.  Pt states she will follow this.

## 2019-06-15 ENCOUNTER — Ambulatory Visit: Payer: Self-pay | Admitting: Skilled Nursing Facility1

## 2019-08-04 ENCOUNTER — Other Ambulatory Visit (HOSPITAL_COMMUNITY): Payer: Self-pay | Admitting: Internal Medicine

## 2019-08-04 ENCOUNTER — Other Ambulatory Visit: Payer: Self-pay | Admitting: Internal Medicine

## 2019-08-04 DIAGNOSIS — R112 Nausea with vomiting, unspecified: Secondary | ICD-10-CM

## 2019-08-05 ENCOUNTER — Other Ambulatory Visit: Payer: Self-pay

## 2019-08-05 ENCOUNTER — Ambulatory Visit (HOSPITAL_COMMUNITY)
Admission: RE | Admit: 2019-08-05 | Discharge: 2019-08-05 | Disposition: A | Discharge status: Home / Self Care | Payer: 59 | Source: Ambulatory Visit | Attending: Internal Medicine | Admitting: Internal Medicine

## 2019-08-05 DIAGNOSIS — R112 Nausea with vomiting, unspecified: Secondary | ICD-10-CM | POA: Diagnosis present

## 2019-08-06 ENCOUNTER — Other Ambulatory Visit (HOSPITAL_COMMUNITY): Payer: Self-pay | Admitting: Internal Medicine

## 2019-08-06 ENCOUNTER — Other Ambulatory Visit: Payer: Self-pay | Admitting: Internal Medicine

## 2019-08-06 DIAGNOSIS — K8000 Calculus of gallbladder with acute cholecystitis without obstruction: Secondary | ICD-10-CM

## 2019-08-13 ENCOUNTER — Encounter (HOSPITAL_COMMUNITY)
Admission: RE | Admit: 2019-08-13 | Discharge: 2019-08-13 | Disposition: A | Discharge status: Home / Self Care | Payer: 59 | Source: Ambulatory Visit | Attending: Internal Medicine | Admitting: Internal Medicine

## 2019-08-13 ENCOUNTER — Ambulatory Visit (HOSPITAL_COMMUNITY)
Admission: RE | Admit: 2019-08-13 | Discharge: 2019-08-13 | Disposition: A | Discharge status: Home / Self Care | Payer: 59 | Source: Ambulatory Visit | Attending: Internal Medicine | Admitting: Internal Medicine

## 2019-08-13 ENCOUNTER — Other Ambulatory Visit: Payer: Self-pay

## 2019-08-13 ENCOUNTER — Ambulatory Visit (HOSPITAL_COMMUNITY): Admission: RE | Admit: 2019-08-13 | Payer: 59 | Source: Ambulatory Visit

## 2019-08-13 DIAGNOSIS — K8000 Calculus of gallbladder with acute cholecystitis without obstruction: Secondary | ICD-10-CM

## 2019-08-13 MED ORDER — TECHNETIUM TC 99M MEBROFENIN IV KIT
5.4000 | PACK | Freq: Once | INTRAVENOUS | Status: AC
  Administered 2019-08-13: 5.4 via INTRAVENOUS

## 2019-08-16 ENCOUNTER — Other Ambulatory Visit (HOSPITAL_COMMUNITY): Payer: Self-pay | Admitting: Internal Medicine

## 2019-08-16 DIAGNOSIS — K8000 Calculus of gallbladder with acute cholecystitis without obstruction: Secondary | ICD-10-CM

## 2019-08-20 ENCOUNTER — Ambulatory Visit: Payer: Self-pay | Admitting: Dietician

## 2019-09-17 ENCOUNTER — Ambulatory Visit: Payer: Self-pay | Admitting: Surgery

## 2019-09-17 NOTE — H&P (Signed)
Surgical H&P  CC: abdominal pain  HPI: Patient known to me following gastrectomy late last year, following up regarding a recent bout of cholecystitis. In the beginning of September, she had an episode of severe lower chest and right upper quadrant pain which radiated to her back, associate with nausea and vomiting, which lasted several hours. She presented to her primary care doctor and an ultrasound was performed which demonstrated a thickened gallbladder wall and cholelithiasis. Subsequent HIDA scan showed a gallbladder ejection fraction of 34% but no acute cholecystitis. She had one more episode the day that she saw her primary care doctor, but since then she has had no symptoms whatsoever. She did see on a bland diet for about a week but is now back to her regular routine and is asymptomatic.  Allergies  Allergen Reactions  . Kiwi Extract Itching  . Nickel Nausea And Vomiting  . Penicillins     Childhood rxn    Past Medical History:  Diagnosis Date  . Arthritis    lower lumbar arthritis   . Blighted ovum 03/31/2013   GS on u/s at [facility] 03/29/13 w/ no ys and no fetal pole, measuring [redacted]w[redacted]d (vs [redacted]w[redacted]d LMP); seen by EP, P.A. 03/29/13 for VB  . BV (bacterial vaginosis)   . GERD (gastroesophageal reflux disease)   . Seasonal asthma   . Spontaneous miscarriage 03/31/2013  . UTI (lower urinary tract infection)     Past Surgical History:  Procedure Laterality Date  . APPENDECTOMY    . CESAREAN SECTION    . DILITATION & CURRETTAGE/HYSTROSCOPY WITH NOVASURE ABLATION N/A 11/04/2014   Procedure: DILATATION & CURETTAGE/HYSTEROSCOPY WITH NOVASURE ABLATION;  Surgeon: Annalee Genta, DO;  Location: [facility];  Service: Gynecology;  Laterality: N/A;  . LAPAROSCOPIC GASTRIC SLEEVE RESECTION N/A 11/17/2018   Procedure: LAPAROSCOPIC GASTRIC SLEEVE RESECTION, UPPER ENDO, ERAS Pathwya;  Surgeon: [name], MD;  Location: [facility];  Service: General;  Laterality: N/A;    Family History  Problem  Relation Age of Onset  . Hypertension Other   . Breast cancer Neg Hx     Social History   Socioeconomic History  . Marital status: Single    Spouse name: Not on file  . Number of children: Not on file  . Years of education: Not on file  . Highest education level: Not on file  Occupational History  . Not on file  Social Needs  . Financial resource strain: Not on file  . Food insecurity    Worry: Not on file    Inability: Not on file  . Transportation needs    Medical: Not on file    Non-medical: Not on file  Tobacco Use  . Smoking status: Former Research scientist (life sciences)  . Smokeless tobacco: Never Used  Substance and Sexual Activity  . Alcohol use: No  . Drug use: No  . Sexual activity: Yes    Birth control/protection: None  Lifestyle  . Physical activity    Days per week: Not on file    Minutes per session: Not on file  . Stress: Not on file  Relationships  . Social Herbalist on phone: Not on file    Gets together: Not on file    Attends religious service: Not on file    Active member of club or organization: Not on file    Attends meetings of clubs or organizations: Not on file    Relationship status: Not on file  Other Topics Concern  .  Not on file  Social History Narrative  . Not on file    Current Outpatient Medications on File Prior to Visit  Medication Sig Dispense Refill  . acetaminophen (TYLENOL) 160 MG/5ML solution Take 20.3 mLs (650 mg total) by mouth every 6 (six) hours as needed. 120 mL 0  . Biotin w/ Vitamins C & E (HAIR/SKIN/NAILS PO) Take 1 tablet by mouth daily.    Marland Kitchen. docusate sodium (COLACE) 100 MG capsule Take 1 capsule (100 mg total) by mouth 2 (two) times daily. 10 capsule 0  . gabapentin (NEURONTIN) 300 MG capsule Take 1 capsule (300 mg total) by mouth every 8 (eight) hours. for 30 days 90 capsule 0  . Multiple Vitamin (MULTIVITAMIN WITH MINERALS) TABS tablet Take 1 tablet by mouth daily.    . ondansetron (ZOFRAN-ODT) 4 MG disintegrating tablet  Take 1 tablet (4 mg total) by mouth every 6 (six) hours as needed for nausea or vomiting. 20 tablet 0  . oxyCODONE (ROXICODONE) 5 MG/5ML solution Take 5 mLs (5 mg total) by mouth every 6 (six) hours as needed for severe pain. 100 mL 0  . pantoprazole (PROTONIX) 40 MG tablet Take 1 tablet (40 mg total) by mouth daily. 90 tablet 0  . sodium fluoride (SF 5000 PLUS) 1.1 % CREA dental cream Place 1 application onto teeth at bedtime.     No current facility-administered medications on file prior to visit.     Review of Systems: a complete, 10pt review of systems was completed with pertinent positives and negatives as documented in the HPI  Physical Exam: Vitals  Weight: 186 lb Height: 65in Body Surface Area: 1.92 m Body Mass Index: 30.95 kg/m  Temp.: 98.26F  Pulse: 103 (Regular)  P.OX: 95% (Room air) BP: 120/74 (Sitting, Left Arm, Standard)  Gen: alert and well appearing Eye: extraocular motion intact, no scleral icterus Chest: unlabored respirations, symmetrical air entry CV: regular rate and rhythm, no pedal edema Abdomen: soft, nontender, nondistended. No mass or organomegaly MSK: strength symmetrical throughout, no deformity Neuro: grossly intact, normal gait Psych: normal mood and affect, appropriate insight Skin: warm and dry, no rash or lesion on limited exam   CBC Latest Ref Rng & Units 11/18/2018 11/13/2018 11/04/2014  WBC 4.0 - 10.5 K/uL 15.0(H) 8.2 11.7(H)  Hemoglobin 12.0 - 15.0 g/dL 16.113.4 09.614.7 04.513.9  Hematocrit 36.0 - 46.0 % 42.1 45.3 41.0  Platelets 150 - 400 K/uL 370 393 319    CMP Latest Ref Rng & Units 11/18/2018 11/13/2018 11/04/2014  Glucose 70 - 99 mg/dL 409(W134(H) 119(J106(H) 88  BUN 6 - 20 mg/dL 8 12 8   Creatinine 0.44 - 1.00 mg/dL 4.780.58 2.950.61 6.210.69  Sodium 135 - 145 mmol/L 136 139 139  Potassium 3.5 - 5.1 mmol/L 4.9 4.3 4.1  Chloride 98 - 111 mmol/L 105 105 100  CO2 22 - 32 mmol/L 23 25 29   Calcium 8.9 - 10.3 mg/dL 8.2(L) 9.2 9.6  Total Protein 6.5 - 8.1  g/dL 6.7 7.1 -  Total Bilirubin 0.3 - 1.2 mg/dL 0.9 0.4 -  Alkaline Phos 38 - 126 U/L 25(L) 27(L) -  AST 15 - 41 U/L 54(H) 24 -  ALT 0 - 44 U/L 34 26 -    No results found for: INR, PROTIME  Imaging: No results found.   A/P: CHOLECYSTITIS (K81.9) Story: Likely had a bout of early cholecystitis which was self resolving given the thickened gallbladder wall and her ultrasound followed by negative HIDA scan. I recommend proceeding with laparoscopic cholecystectomy  with possible cholangiogram. Discussed risks of surgery including bleeding, pain, scarring, intraabdominal injury specifically to the common bile duct and sequelae, bile leak, conversion to open surgery, failure to resolve symptoms, blood clots/ pulmonary embolus, heart attack, pneumonia, stroke, death. Questions welcomed and answered to patient's satisfaction. She wishes to proceed with surgery.   Phylliss Blakes, MD [facility], [place] Pager [phone]

## 2020-01-06 ENCOUNTER — Other Ambulatory Visit: Payer: Self-pay | Admitting: Obstetrics & Gynecology

## 2020-01-06 DIAGNOSIS — Z1231 Encounter for screening mammogram for malignant neoplasm of breast: Secondary | ICD-10-CM

## 2020-01-10 ENCOUNTER — Other Ambulatory Visit: Payer: Self-pay

## 2020-01-10 ENCOUNTER — Ambulatory Visit
Admission: RE | Admit: 2020-01-10 | Discharge: 2020-01-10 | Disposition: A | Discharge status: Home / Self Care | Payer: 59 | Source: Ambulatory Visit

## 2020-01-10 DIAGNOSIS — Z1231 Encounter for screening mammogram for malignant neoplasm of breast: Secondary | ICD-10-CM

## 2021-01-24 ENCOUNTER — Other Ambulatory Visit: Payer: Self-pay | Admitting: Obstetrics & Gynecology

## 2021-01-24 DIAGNOSIS — Z1231 Encounter for screening mammogram for malignant neoplasm of breast: Secondary | ICD-10-CM

## 2021-03-02 DIAGNOSIS — Z1231 Encounter for screening mammogram for malignant neoplasm of breast: Secondary | ICD-10-CM

## 2021-06-15 ENCOUNTER — Encounter (HOSPITAL_COMMUNITY): Payer: Self-pay | Admitting: *Deleted

## 2021-06-21 ENCOUNTER — Other Ambulatory Visit: Payer: Self-pay | Admitting: Obstetrics and Gynecology

## 2021-06-21 DIAGNOSIS — Z1231 Encounter for screening mammogram for malignant neoplasm of breast: Secondary | ICD-10-CM

## 2021-06-30 ENCOUNTER — Ambulatory Visit
Admission: RE | Admit: 2021-06-30 | Discharge: 2021-06-30 | Disposition: A | Discharge status: Home / Self Care | Payer: 59 | Source: Ambulatory Visit

## 2021-06-30 ENCOUNTER — Other Ambulatory Visit: Payer: Self-pay

## 2021-06-30 DIAGNOSIS — Z1231 Encounter for screening mammogram for malignant neoplasm of breast: Secondary | ICD-10-CM

## 2022-03-27 ENCOUNTER — Other Ambulatory Visit: Payer: Self-pay | Admitting: Internal Medicine

## 2022-03-27 ENCOUNTER — Ambulatory Visit
Admission: RE | Admit: 2022-03-27 | Discharge: 2022-03-27 | Disposition: A | Discharge status: Home / Self Care | Payer: 59 | Source: Ambulatory Visit | Attending: Internal Medicine | Admitting: Internal Medicine

## 2022-03-27 DIAGNOSIS — M545 Low back pain, unspecified: Secondary | ICD-10-CM

## 2022-03-27 DIAGNOSIS — M549 Dorsalgia, unspecified: Secondary | ICD-10-CM

## 2022-05-20 ENCOUNTER — Other Ambulatory Visit: Payer: Self-pay | Admitting: Sports Medicine

## 2022-05-20 DIAGNOSIS — M545 Low back pain, unspecified: Secondary | ICD-10-CM

## 2022-06-01 ENCOUNTER — Ambulatory Visit
Admission: RE | Admit: 2022-06-01 | Discharge: 2022-06-01 | Disposition: A | Discharge status: Home / Self Care | Payer: 59 | Source: Ambulatory Visit | Attending: Sports Medicine | Admitting: Sports Medicine

## 2022-06-01 DIAGNOSIS — M545 Low back pain, unspecified: Secondary | ICD-10-CM

## 2022-06-11 ENCOUNTER — Other Ambulatory Visit: Payer: Self-pay | Admitting: Obstetrics and Gynecology

## 2022-06-11 DIAGNOSIS — Z1231 Encounter for screening mammogram for malignant neoplasm of breast: Secondary | ICD-10-CM

## 2022-06-20 ENCOUNTER — Encounter (HOSPITAL_COMMUNITY): Payer: Self-pay | Admitting: *Deleted

## 2022-07-05 ENCOUNTER — Ambulatory Visit
Admission: RE | Admit: 2022-07-05 | Discharge: 2022-07-05 | Disposition: A | Discharge status: Home / Self Care | Payer: 59 | Source: Ambulatory Visit

## 2022-07-05 DIAGNOSIS — Z1231 Encounter for screening mammogram for malignant neoplasm of breast: Secondary | ICD-10-CM

## 2022-12-06 ENCOUNTER — Other Ambulatory Visit: Payer: Self-pay | Admitting: Internal Medicine

## 2022-12-06 DIAGNOSIS — Z122 Encounter for screening for malignant neoplasm of respiratory organs: Secondary | ICD-10-CM

## 2023-01-17 ENCOUNTER — Ambulatory Visit
Admission: RE | Admit: 2023-01-17 | Discharge: 2023-01-17 | Disposition: A | Discharge status: Home / Self Care | Payer: 59 | Source: Ambulatory Visit | Attending: Internal Medicine | Admitting: Internal Medicine

## 2023-01-17 DIAGNOSIS — Z122 Encounter for screening for malignant neoplasm of respiratory organs: Secondary | ICD-10-CM

## 2023-06-09 ENCOUNTER — Other Ambulatory Visit: Payer: Self-pay | Admitting: Internal Medicine

## 2023-06-09 DIAGNOSIS — Z1231 Encounter for screening mammogram for malignant neoplasm of breast: Secondary | ICD-10-CM

## 2023-06-26 ENCOUNTER — Encounter (HOSPITAL_COMMUNITY): Payer: Self-pay | Admitting: *Deleted

## 2023-07-07 ENCOUNTER — Ambulatory Visit
Admission: RE | Admit: 2023-07-07 | Discharge: 2023-07-07 | Disposition: A | Discharge status: Home / Self Care | Payer: 59 | Source: Ambulatory Visit

## 2023-07-07 DIAGNOSIS — Z1231 Encounter for screening mammogram for malignant neoplasm of breast: Secondary | ICD-10-CM

## 2023-12-12 ENCOUNTER — Other Ambulatory Visit (HOSPITAL_COMMUNITY): Payer: Self-pay | Admitting: Internal Medicine

## 2023-12-12 DIAGNOSIS — Z122 Encounter for screening for malignant neoplasm of respiratory organs: Secondary | ICD-10-CM

## 2024-01-23 ENCOUNTER — Ambulatory Visit (HOSPITAL_COMMUNITY)
Admission: RE | Admit: 2024-01-23 | Discharge: 2024-01-23 | Disposition: A | Discharge status: Home / Self Care | Payer: 59 | Source: Ambulatory Visit | Attending: Internal Medicine | Admitting: Internal Medicine

## 2024-01-23 DIAGNOSIS — Z122 Encounter for screening for malignant neoplasm of respiratory organs: Secondary | ICD-10-CM | POA: Insufficient documentation

## 2024-03-31 ENCOUNTER — Other Ambulatory Visit: Payer: Self-pay | Admitting: Physician Assistant

## 2024-03-31 ENCOUNTER — Ambulatory Visit
Admission: RE | Admit: 2024-03-31 | Discharge: 2024-03-31 | Disposition: A | Discharge status: Home / Self Care | Source: Ambulatory Visit | Attending: Physician Assistant

## 2024-03-31 DIAGNOSIS — K5900 Constipation, unspecified: Secondary | ICD-10-CM

## 2024-06-03 ENCOUNTER — Other Ambulatory Visit: Payer: Self-pay | Admitting: Internal Medicine

## 2024-06-03 DIAGNOSIS — Z1231 Encounter for screening mammogram for malignant neoplasm of breast: Secondary | ICD-10-CM

## 2024-07-07 ENCOUNTER — Ambulatory Visit
Admission: RE | Admit: 2024-07-07 | Discharge: 2024-07-07 | Disposition: A | Discharge status: Home / Self Care | Source: Ambulatory Visit

## 2024-07-07 DIAGNOSIS — Z1231 Encounter for screening mammogram for malignant neoplasm of breast: Secondary | ICD-10-CM
